# Patient Record
Sex: Female | Born: 1945 | Race: White
Health system: Southern US, Community
[De-identification: ages and names within clinical notes are randomized; demographics above are authoritative.]

## PROBLEM LIST (undated history)

## (undated) DIAGNOSIS — E785 Hyperlipidemia, unspecified: Secondary | ICD-10-CM

## (undated) HISTORY — DX: Hyperlipidemia, unspecified: E78.5

---

## 2016-01-04 ENCOUNTER — Other Ambulatory Visit (INDEPENDENT_AMBULATORY_CARE_PROVIDER_SITE_OTHER): Payer: Self-pay

## 2016-01-04 LAB — COMPREHENSIVE METABOLIC PANEL
ALT: 25 U/L (ref 10–50)
AST (SGOT): 17 U/L (ref 0–40)
African American eGFR: 115.79
Albumin: 4.2 g/dL (ref 3.5–5.2)
Alkaline Phosphatase: 39 U/L (ref 35–130)
BUN / Creatinine Ratio: 26.9
BUN: 17 mg/dL (ref 6–23)
Bilirubin, Total: 0.4 mg/dL (ref 0.0–1.2)
CO2: 22 mmol/L (ref 19–31)
Calcium: 9.3 mg/dL (ref 8.6–10.2)
Chloride: 104 mEq/l (ref 97–107)
Creatinine: 0.61 mg/dL (ref 0.50–1.00)
Glucose: 119 mg/dL — ABNORMAL HIGH (ref 74–106)
Potassium: 4.2 mmol/L (ref 3.5–5.1)
Protein, Total: 6.22 g/dL — ABNORMAL LOW (ref 6.30–8.70)
Sodium: 140 mmol/L (ref 135–145)
non-African American eGFR: 95.69

## 2016-01-04 LAB — LIPID PANEL
Cholesterol / HDL Ratio: 2.4 (ref 0.0–3.6)
Cholesterol: 117 mg/dL (ref 0–200)
HDL: 49 mg/dL (ref >40)
LDL Calculated: 49 mg/dL (ref 0–129)
NON HDL CHOLESTEROL: 67 mg/dL
Triglycerides: 92 mg/dL (ref 0–150)

## 2016-01-04 LAB — HEMOGLOBIN A1C: Hemoglobin A1C: 5.8 g/dL — ABNORMAL HIGH (ref 3.8–5.7)

## 2017-01-12 LAB — COMPREHENSIVE METABOLIC PANEL
ALT: 29 U/L (ref 10–50)
AST (SGOT): 25 U/L (ref 0–40)
African American eGFR: 117.32
Albumin: 4.3 g/dL (ref 3.5–5.2)
Alkaline Phosphatase: 44 U/L (ref 35–130)
BUN / Creatinine Ratio: 27.9
BUN: 17 mg/dL (ref 6–23)
Bilirubin, Total: 0.4 mg/dL (ref 0.0–1.2)
CO2: 27 mmol/L (ref 19–31)
Calcium: 9.8 mg/dL (ref 8.6–10.2)
Chloride: 101 mEq/l (ref 97–107)
Creatinine: 0.61 mg/dL (ref 0.50–1.00)
Glucose: 130 mg/dL — ABNORMAL HIGH (ref 74–106)
Potassium: 4.6 mmol/L (ref 3.5–5.1)
Protein, Total: 6.8 g/dL (ref 6.30–8.70)
Sodium: 139 mmol/L (ref 135–145)
non-African American eGFR: 96.96

## 2017-01-12 LAB — LIPID PANEL
Cholesterol / HDL Ratio: 3.3 (ref 0.0–3.6)
Cholesterol: 143 mg/dL (ref 0–200)
HDL: 43 mg/dL (ref >40)
LDL Calculated: 75 mg/dL (ref 0–129)
NON HDL CHOLESTEROL: 100 mg/dL
Triglycerides: 128 mg/dL (ref 0–150)

## 2017-01-12 LAB — HEMOGLOBIN A1C: Hemoglobin A1C: 6.3 g/dL — ABNORMAL HIGH (ref 3.8–5.7)

## 2017-05-15 LAB — BASIC METABOLIC PANEL
African American eGFR: 103.04
BUN / Creatinine Ratio: 36.7
BUN: 25 mg/dL — ABNORMAL HIGH (ref 6–23)
CO2: 26 mmol/L (ref 19–31)
Calcium: 9.9 mg/dL (ref 8.6–10.2)
Chloride: 102 mEq/l (ref 97–107)
Creatinine: 0.68 mg/dL (ref 0.50–1.00)
Glucose: 125 mg/dL — ABNORMAL HIGH (ref 74–106)
Potassium: 4.3 mmol/L (ref 3.5–5.1)
Sodium: 138 mmol/L (ref 135–145)
non-African American eGFR: 85.16

## 2017-05-15 LAB — HEMOGLOBIN A1C: Hemoglobin A1C: 5.6 g/dL (ref 3.8–5.7)

## 2017-09-07 LAB — BASIC METABOLIC PANEL
African American eGFR: 98.84
BUN / Creatinine Ratio: 33.8
BUN: 24 mg/dL — ABNORMAL HIGH (ref 6–23)
CO2: 28 mmol/L (ref 19–31)
Calcium: 10.2 mg/dL (ref 8.6–10.2)
Chloride: 105 mEq/l (ref 97–107)
Creatinine: 0.7 mg/dL (ref 0.50–1.00)
Glucose: 123 mg/dL — ABNORMAL HIGH (ref 74–106)
Potassium: 4.9 mmol/L (ref 3.5–5.1)
Sodium: 144 mmol/L (ref 135–145)
non-African American eGFR: 81.69

## 2018-01-20 LAB — COMPREHENSIVE METABOLIC PANEL
ALT: 22 U/L (ref 10–50)
AST (SGOT): 19 U/L (ref 0–40)
African American eGFR: 97.33
Albumin: 4.4 g/dL (ref 3.5–5.2)
Alkaline Phosphatase: 43 U/L (ref 35–130)
BUN / Creatinine Ratio: 24.3
BUN: 17 mg/dL (ref 6–23)
Bilirubin, Total: 0.4 mg/dL (ref 0.0–1.2)
CO2: 25 mmol/L (ref 19–31)
Calcium: 9.7 mg/dL (ref 8.6–10.2)
Chloride: 104 mEq/l (ref 97–107)
Creatinine: 0.71 mg/dL (ref 0.50–1.00)
Glucose: 130 mg/dL — ABNORMAL HIGH (ref 74–106)
Potassium: 4.4 mmol/L (ref 3.5–5.1)
Protein, Total: 6.61 g/dL (ref 6.30–8.70)
Sodium: 141 mmol/L (ref 135–145)
non-African American eGFR: 80.44

## 2018-01-20 LAB — LIPID PANEL
Cholesterol / HDL Ratio: 2.9 (ref 0.0–3.6)
Cholesterol: 133 mg/dL (ref 0–200)
HDL: 46 mg/dL (ref >40)
LDL Calculated: 63 mg/dL (ref 0–129)
NON HDL CHOLESTEROL: 86 mg/dL
Triglycerides: 118 mg/dL (ref 0–150)

## 2018-01-20 LAB — HEMOGLOBIN A1C: Hemoglobin A1C: 5.7 g/dL (ref 3.8–5.7)

## 2018-06-15 ENCOUNTER — Other Ambulatory Visit (INDEPENDENT_AMBULATORY_CARE_PROVIDER_SITE_OTHER): Payer: Self-pay

## 2018-06-16 LAB — BASIC METABOLIC PANEL
African American eGFR: 97 mL/min/{1.73_m2} (ref >=60)
BUN / Creatinine Ratio: 29.6 (calc) (ref 6–22)
BUN: 21 mg/dL (ref 7–25)
CO2: 27 mmol/L (ref 20–32)
Calcium: 10 mg/dL (ref 8.6–10.4)
Chloride: 105 mmol/L (ref 98–110)
Creatinine: 0.71 mg/dL (ref 0.60–0.93)
Glucose: 112 mg/dL — ABNORMAL HIGH (ref 65–99)
Potassium: 5.5 mmol/L — ABNORMAL HIGH (ref 3.5–5.3)
Sodium: 140 mmol/L (ref 135–146)
non-African American eGFR: 80 mL/min/{1.73_m2} (ref >=60)

## 2018-06-16 LAB — HEMOGLOBIN A1C: Hemoglobin A1C: 5.4 % of total Hgb (ref <5.7)

## 2018-12-30 ENCOUNTER — Encounter (INDEPENDENT_AMBULATORY_CARE_PROVIDER_SITE_OTHER): Payer: Self-pay

## 2019-01-12 ENCOUNTER — Encounter (INDEPENDENT_AMBULATORY_CARE_PROVIDER_SITE_OTHER): Payer: Self-pay | Admitting: Internal Medicine

## 2019-01-12 DIAGNOSIS — Z9884 Bariatric surgery status: Secondary | ICD-10-CM | POA: Insufficient documentation

## 2019-01-12 DIAGNOSIS — M85862 Other specified disorders of bone density and structure, left lower leg: Secondary | ICD-10-CM | POA: Insufficient documentation

## 2019-01-12 DIAGNOSIS — R778 Other specified abnormalities of plasma proteins: Secondary | ICD-10-CM | POA: Insufficient documentation

## 2019-01-12 DIAGNOSIS — R790 Abnormal level of blood mineral: Secondary | ICD-10-CM | POA: Insufficient documentation

## 2019-01-12 DIAGNOSIS — R7302 Impaired glucose tolerance (oral): Secondary | ICD-10-CM | POA: Insufficient documentation

## 2019-01-12 DIAGNOSIS — M47816 Spondylosis without myelopathy or radiculopathy, lumbar region: Secondary | ICD-10-CM | POA: Insufficient documentation

## 2019-01-12 DIAGNOSIS — E041 Nontoxic single thyroid nodule: Secondary | ICD-10-CM | POA: Insufficient documentation

## 2019-01-12 DIAGNOSIS — K2950 Unspecified chronic gastritis without bleeding: Secondary | ICD-10-CM | POA: Insufficient documentation

## 2019-01-12 DIAGNOSIS — Z889 Allergy status to unspecified drugs, medicaments and biological substances status: Secondary | ICD-10-CM | POA: Insufficient documentation

## 2019-01-12 DIAGNOSIS — D803 Selective deficiency of immunoglobulin G [IgG] subclasses: Secondary | ICD-10-CM | POA: Insufficient documentation

## 2019-01-12 DIAGNOSIS — E875 Hyperkalemia: Secondary | ICD-10-CM | POA: Insufficient documentation

## 2019-01-12 DIAGNOSIS — R202 Paresthesia of skin: Secondary | ICD-10-CM | POA: Insufficient documentation

## 2019-01-12 DIAGNOSIS — E349 Endocrine disorder, unspecified: Secondary | ICD-10-CM | POA: Insufficient documentation

## 2019-01-12 DIAGNOSIS — H269 Unspecified cataract: Secondary | ICD-10-CM | POA: Insufficient documentation

## 2019-01-12 DIAGNOSIS — G629 Polyneuropathy, unspecified: Secondary | ICD-10-CM | POA: Insufficient documentation

## 2019-01-12 DIAGNOSIS — R911 Solitary pulmonary nodule: Secondary | ICD-10-CM | POA: Insufficient documentation

## 2019-01-12 DIAGNOSIS — I2510 Atherosclerotic heart disease of native coronary artery without angina pectoris: Secondary | ICD-10-CM | POA: Insufficient documentation

## 2019-01-12 DIAGNOSIS — F4320 Adjustment disorder, unspecified: Secondary | ICD-10-CM | POA: Insufficient documentation

## 2019-01-12 DIAGNOSIS — R9431 Abnormal electrocardiogram [ECG] [EKG]: Secondary | ICD-10-CM | POA: Insufficient documentation
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
## 2019-01-26 ENCOUNTER — Ambulatory Visit (FREE_STANDING_LABORATORY_FACILITY): Payer: Medicare Other

## 2019-01-26 ENCOUNTER — Encounter (INDEPENDENT_AMBULATORY_CARE_PROVIDER_SITE_OTHER): Payer: Self-pay

## 2019-01-26 VITALS — BP 132/78 | HR 75 | Temp 96.9°F | Ht 59.0 in | Wt 134.0 lb

## 2019-01-26 DIAGNOSIS — M85862 Other specified disorders of bone density and structure, left lower leg: Secondary | ICD-10-CM

## 2019-01-26 DIAGNOSIS — Z0000 Encounter for general adult medical examination without abnormal findings: Secondary | ICD-10-CM

## 2019-01-26 DIAGNOSIS — K2950 Unspecified chronic gastritis without bleeding: Secondary | ICD-10-CM

## 2019-01-26 DIAGNOSIS — E785 Hyperlipidemia, unspecified: Secondary | ICD-10-CM | POA: Insufficient documentation

## 2019-01-26 DIAGNOSIS — Z13820 Encounter for screening for osteoporosis: Secondary | ICD-10-CM

## 2019-01-26 DIAGNOSIS — R7302 Impaired glucose tolerance (oral): Secondary | ICD-10-CM

## 2019-01-26 DIAGNOSIS — E782 Mixed hyperlipidemia: Secondary | ICD-10-CM

## 2019-01-26 DIAGNOSIS — M47816 Spondylosis without myelopathy or radiculopathy, lumbar region: Secondary | ICD-10-CM

## 2019-01-26 DIAGNOSIS — E559 Vitamin D deficiency, unspecified: Secondary | ICD-10-CM

## 2019-01-26 DIAGNOSIS — Z1231 Encounter for screening mammogram for malignant neoplasm of breast: Secondary | ICD-10-CM

## 2019-01-26 DIAGNOSIS — R946 Abnormal results of thyroid function studies: Secondary | ICD-10-CM

## 2019-01-26 DIAGNOSIS — Z1389 Encounter for screening for other disorder: Secondary | ICD-10-CM

## 2019-01-26 DIAGNOSIS — Z9884 Bariatric surgery status: Secondary | ICD-10-CM

## 2019-01-26 LAB — CBC AND DIFFERENTIAL
Absolute NRBC: 0 10*3/uL (ref 0.00–0.00)
Basophils Absolute Automated: 0.05 10*3/uL (ref 0.00–0.08)
Basophils Automated: 0.7 %
Eosinophils Absolute Automated: 0.11 10*3/uL (ref 0.00–0.44)
Eosinophils Automated: 1.6 %
Hematocrit: 41.2 % (ref 34.7–43.7)
Hgb: 13.3 g/dL (ref 11.4–14.8)
Immature Granulocytes Absolute: 0.02 10*3/uL (ref 0.00–0.07)
Immature Granulocytes: 0.3 %
Lymphocytes Absolute Automated: 1.77 10*3/uL (ref 0.42–3.22)
Lymphocytes Automated: 25.3 %
MCH: 30 pg (ref 25.1–33.5)
MCHC: 32.3 g/dL (ref 31.5–35.8)
MCV: 92.8 fL (ref 78.0–96.0)
MPV: 10.6 fL (ref 8.9–12.5)
Monocytes Absolute Automated: 0.67 10*3/uL (ref 0.21–0.85)
Monocytes: 9.6 %
Neutrophils Absolute: 4.37 10*3/uL (ref 1.10–6.33)
Neutrophils: 62.5 %
Nucleated RBC: 0 /100 WBC (ref 0.0–0.0)
Platelets: 261 10*3/uL (ref 142–346)
RBC: 4.44 10*6/uL (ref 3.90–5.10)
RDW: 13 % (ref 11–15)
WBC: 6.99 10*3/uL (ref 3.10–9.50)

## 2019-01-26 MED ORDER — GABAPENTIN 300 MG PO CAPS
300.00 mg | ORAL_CAPSULE | Freq: Two times a day (BID) | ORAL | 1 refills | Status: DC
Start: 2019-01-26 — End: 2019-05-10

## 2019-01-26 MED ORDER — PANTOPRAZOLE SODIUM 40 MG PO TBEC
40.00 mg | DELAYED_RELEASE_TABLET | Freq: Every day | ORAL | 1 refills | Status: DC | PRN
Start: 2019-01-26 — End: 2019-01-26

## 2019-01-26 MED ORDER — METFORMIN HCL 500 MG PO TABS
250.0000 mg | ORAL_TABLET | Freq: Two times a day (BID) | ORAL | 4 refills | Status: DC
Start: 2019-01-26 — End: 2020-02-06

## 2019-01-26 MED ORDER — FLUOXETINE HCL 20 MG PO CAPS
20.00 mg | ORAL_CAPSULE | Freq: Every day | ORAL | 4 refills | Status: DC
Start: 2019-01-26 — End: 2019-10-22

## 2019-01-26 MED ORDER — ZOLPIDEM TARTRATE ER 6.25 MG PO TBCR
6.25 mg | EXTENDED_RELEASE_TABLET | Freq: Every evening | ORAL | 1 refills | Status: DC | PRN
Start: 2019-01-26 — End: 2020-02-06

## 2019-01-26 MED ORDER — ALENDRONATE SODIUM 70 MG PO TABS
70.0000 mg | ORAL_TABLET | ORAL | 4 refills | Status: DC
Start: 2019-01-26 — End: 2019-07-02

## 2019-01-26 NOTE — Progress Notes (Signed)
[facility] INTERNAL MEDICINE - AN [place] [facility]                 Date of Exam: 01/26/2019 5:56 PM        Patient ID: Courtney Solis is a 73 y.o. female.        Chief Complaint:    Chief Complaint   Patient presents with   . Medicare Annual Wellness Visit - Subsequent         Screening tests for dementia, fall, depression and functional assessment done       HPI:    HPI   Visit Type: Health Maintenance Visit  Reported Health: excellent health  Dental: dentist visit within 6-12 months  Vision: glasses and regular eye exams   Hearing: normal hearing  Immunization Status: immunizations up to date  Reproductive Health: not currently sexually active  Safety Elements Used: uses seat belts and smoke detectors in household            Problem List    Patient Active Problem List   Diagnosis   . Abnormal EKG   . Abnormal SPEP   . Adult situational stress disorder   . Benign thyroid cyst   . Cataract, acquired   . Chronic gastritis without bleeding, unspecified gastritis type   . Coronary artery calcification seen on CAT scan   . Elevated parathyroid hormone   . High potassium   . History of gastric bypass   . IgG deficiency   . IGT (impaired glucose tolerance)   . Low magnesium level   . Multiple allergies   . Notalgia paresthetica   . Neuropathy   . Osteopenia of left lower leg   . Pulmonary nodule   . Spondylosis of lumbar spine   . Hyperlipidemia             Problem List History    Problem   Hyperlipidemia                Current Meds:    Outpatient Medications Marked as Taking for the 01/26/19 encounter (Office Visit) with Itzae Miralles, Ashley Akin, MD   Medication Sig Dispense Refill   . [DISCONTINUED] alendronate (FOSAMAX) 70 MG tablet Take 1 tablet by mouth once a week     . [DISCONTINUED] metFORMIN (GLUCOPHAGE) 500 MG tablet Take 500 mg by mouth daily            Allergies:    Allergies   Allergen Reactions   . Bee Pollen Anaphylaxis     Carries epi pens   . Bee Venom    . Kiwi Extract    . Wound Dressing Adhesive Itching and  Rash             Past Surgical History:    Past Surgical History:   Procedure Laterality Date   . ARTHROSCOPY, KNEE Right 1980   . BREAST BIOPSY Left 1996   . CATARACT EXTRACTION, BILATERAL  2019   . CHOLECYSTECTOMY  2011   . CHOLECYSTECTOMY  2016   . GASTRIC BYPASS  11/29/2002   . REDUCTION MAMMAPLASTY  12/2011   . ROTATOR CUFF REPAIR Right            Family History:    Family History   Problem Relation Age of Onset   . Hyperlipidemia Mother    . Breast cancer Mother    . Thyroid disease Mother    . Stroke Father 68   . Thyroid disease Sister    .  Lymphoma Maternal Grandfather    . Diabetes Other    . Breast cancer Maternal Aunt            Social History:    Social History     Socioeconomic History   . Marital status: Legally Separated     Spouse name: None   . Number of children: None   . Years of education: None   . Highest education level: None   Occupational History   . None   Social Needs   . Financial resource strain: None   . Food insecurity     Worry: None     Inability: None   . Transportation needs     Medical: None     Non-medical: None   Tobacco Use   . Smoking status: Former Smoker     Packs/day: 0.50     Years: 34.00     Pack years: 17.00     Last attempt to quit: 2004     Years since quitting: 16.5   . Smokeless tobacco: Never Used   Substance and Sexual Activity   . Alcohol use: Yes     Frequency: Monthly or less     Drinks per session: 1 or 2     Binge frequency: Monthly     Comment: occasional   . Drug use: Never   . Sexual activity: None   Lifestyle   . Physical activity     Days per week: 3 days     Minutes per session: 60 min   . Stress: None   Relationships   . Social Wellsite geologist on phone: None     Gets together: None     Attends religious service: None     Active member of club or organization: None     Attends meetings of clubs or organizations: None     Relationship status: None   . Intimate partner violence     Fear of current or ex partner: None     Emotionally abused: None      Physically abused: None     Forced sexual activity: None   Other Topics Concern   . None   Social History Narrative   . None           The following sections were reviewed this encounter by the provider:   Tobacco  Allergies  Meds  Problems  Med Hx  Surg Hx  Fam Hx             Vital Signs:    BP 132/78   Pulse 75   Temp (!) 96.9 F (36.1 C) (Temporal)   Ht 1.499 m (4\' 11" )   Wt 60.8 kg (134 lb)   BMI 27.06 kg/m          ROS:    Review of Systems   Constitutional: Negative for fatigue.   HENT: Negative for sinus pain.    Eyes: Negative for visual disturbance.   Respiratory: Negative for cough, chest tightness, shortness of breath and wheezing.    Cardiovascular: Negative for chest pain, palpitations and leg swelling.   Gastrointestinal: Negative for abdominal distention, abdominal pain, blood in stool, constipation, diarrhea and nausea.   Endocrine: Negative for polydipsia and polyphagia.   Genitourinary: Negative for difficulty urinating, dysuria and hematuria.   Musculoskeletal: Positive for back pain (did needle therapy this am, feels it is helping).   Skin: Negative for color change.   Neurological: Negative for  dizziness, weakness, numbness and headaches.   Hematological: Does not bruise/bleed easily.   Psychiatric/Behavioral: Negative.         Feels emotionally stable with current regimen              Physical Exam:    Physical Exam  Vitals signs reviewed.   Constitutional:       Appearance: Normal appearance.   HENT:      Head: Normocephalic and atraumatic.      Right Ear: Tympanic membrane and ear canal normal.      Left Ear: Tympanic membrane and ear canal normal.   Eyes:      Extraocular Movements: Extraocular movements intact.      Conjunctiva/sclera: Conjunctivae normal.   Neck:      Musculoskeletal: Neck supple. No muscular tenderness.      Vascular: No carotid bruit.   Cardiovascular:      Rate and Rhythm: Normal rate and regular rhythm.      Pulses: Normal pulses.      Heart sounds:  Normal heart sounds. No murmur. No friction rub. No gallop.    Pulmonary:      Effort: Pulmonary effort is normal. No respiratory distress.      Breath sounds: Normal breath sounds. No wheezing.   Chest:      Breasts:         Right: Tenderness present. No inverted nipple or skin change.         Left: Tenderness present. No inverted nipple or skin change.      Comments: Dense breast tissue , +scar tissue noted at site of breast reduction surgery    Abdominal:      General: Bowel sounds are normal.      Palpations: Abdomen is soft.   Musculoskeletal:         General: No swelling or tenderness.      Right lower leg: No edema.      Left lower leg: No edema.   Skin:     General: Skin is warm.      Findings: No rash.   Neurological:      General: No focal deficit present.      Mental Status: She is alert and oriented to person, place, and time.   Psychiatric:         Mood and Affect: Mood normal.         Behavior: Behavior normal.              Assessment/Plan:    Health Maintenance:  Recommend optimizing low carbohydrate diet efforts and obtaining at least 150 minutes of aerobic exercise per week. Recommend 20-25 grams of dietary fiber daily. Recommend drinking at least 60-80 ounces of water per day.  Immunizations UTD. Vision screening UTD. Mammogram screening is due.  Screening mammogram ordered. Osteoporosis screening is due.  Dexa ordered.    Problem List Items Addressed This Visit     Chronic gastritis without bleeding, unspecified gastritis type    Pt had repeat EGD done in 2012, it was rec to repeat EGD in 3 years, however, pt declined , she is only taking Protonix 3x week    pantoprazole (PROTONIX) 40 MG tablet        Relevant Ordersdoin    CBC and differential    Lipid panel    IGT (impaired glucose tolerance)    Tried to decrease her Metformin dose but noted inc in her weight gain, pt went back to bid dosing, she continues  to monitor her diet     metFORMIN (GLUCOPHAGE) 500 MG tablet    Other Relevant Orders     Comprehensive metabolic panel    Urinalysis    Hemoglobin A1C    Osteopenia of left lower leg    Tolerating Fosamax, no GI s/e, pt is due for another Dexa    alendronate (FOSAMAX) 70 MG tablet    Other Relevant Orders    TSH    Dxa Bone Density Appendicular Skeleton    Spondylosis of lumbar spine    Starting to note increase lower ext pain, wants to go back to a higher dose of Gabapentin  She also started Dry needling today and notes improvement  She is also interested in CBD treatment, rec she discuss this with her Pain Specialist    gabapentin (NEURONTIN) 300 MG capsule    Other Relevant Orders    CBC and differential    TSH    Hyperlipidemia    Tolerating Lipitor, takes med every other day    atorvastatin (LIPITOR) 10 MG tablet    Other Relevant Orders    Lipid panel    TSH      Other Visit Diagnoses     Medicare annual wellness visit, subsequent    -  Primary    Screening mammogram, encounter for        Relevant Orders    Mammo Digital Screening Bilateral W Cad    Osteoporosis screening        Abnormal thyroid exam          Relevant Orders    [place] Head Neck Soft Tissue    Screening for blood or protein in urine        Relevant Orders    Urinalysis    Vitamin D deficiency        takin gsupplement    Vitamin D,25 OH, Total                     Follow-up:    F/u in 6 months , as needed, pending lab results        Theodosia Blender MD

## 2019-01-27 LAB — COMPREHENSIVE METABOLIC PANEL
ALT: 18 U/L (ref 0–55)
AST (SGOT): 18 U/L (ref 5–34)
Albumin/Globulin Ratio: 1.7 (ref 0.9–2.2)
Albumin: 4.3 g/dL (ref 3.5–5.0)
Alkaline Phosphatase: 40 U/L (ref 37–106)
Anion Gap: 12 (ref 5.0–15.0)
BUN: 21 mg/dL — ABNORMAL HIGH (ref 7.0–19.0)
Bilirubin, Total: 0.6 mg/dL (ref 0.2–1.2)
CO2: 21 mEq/L (ref 21–29)
Calcium: 9.4 mg/dL (ref 7.9–10.2)
Chloride: 106 mEq/L (ref 100–111)
Creatinine: 0.7 mg/dL (ref 0.4–1.5)
Globulin: 2.5 g/dL (ref 2.0–3.7)
Glucose: 100 mg/dL (ref 70–100)
Potassium: 4.4 mEq/L (ref 3.5–5.1)
Protein, Total: 6.8 g/dL (ref 6.0–8.3)
Sodium: 139 mEq/L (ref 136–145)

## 2019-01-27 LAB — VITAMIN D,25 OH,TOTAL: Vitamin D, 25 OH, Total: 58 ng/mL (ref 30–100)

## 2019-01-27 LAB — GFR: EGFR: >60

## 2019-01-27 LAB — LIPID PANEL
Cholesterol / HDL Ratio: 3
Cholesterol: 150 mg/dL (ref 0–199)
HDL: 50 mg/dL (ref 40–9999)
LDL Calculated: 81 mg/dL (ref 0–99)
Triglycerides: 96 mg/dL (ref 34–149)
VLDL Calculated: 19 mg/dL (ref 10–40)

## 2019-01-27 LAB — HEMOLYSIS INDEX: Hemolysis Index: 6 (ref 0–18)

## 2019-01-27 LAB — HEMOGLOBIN A1C
Average Estimated Glucose: 105.4 mg/dL
Hemoglobin A1C: 5.3 % (ref 4.6–5.9)

## 2019-01-27 LAB — TSH: TSH: 1.1 u[IU]/mL (ref 0.35–4.94)

## 2019-02-01 ENCOUNTER — Other Ambulatory Visit (INDEPENDENT_AMBULATORY_CARE_PROVIDER_SITE_OTHER): Payer: Self-pay

## 2019-02-19 ENCOUNTER — Encounter (INDEPENDENT_AMBULATORY_CARE_PROVIDER_SITE_OTHER): Payer: Self-pay

## 2019-02-19 DIAGNOSIS — E041 Nontoxic single thyroid nodule: Secondary | ICD-10-CM | POA: Insufficient documentation

## 2019-03-02 ENCOUNTER — Encounter (INDEPENDENT_AMBULATORY_CARE_PROVIDER_SITE_OTHER): Payer: Self-pay

## 2019-04-14 ENCOUNTER — Encounter (INDEPENDENT_AMBULATORY_CARE_PROVIDER_SITE_OTHER): Payer: Self-pay

## 2019-05-10 ENCOUNTER — Other Ambulatory Visit (INDEPENDENT_AMBULATORY_CARE_PROVIDER_SITE_OTHER): Payer: Self-pay

## 2019-05-10 DIAGNOSIS — M47816 Spondylosis without myelopathy or radiculopathy, lumbar region: Secondary | ICD-10-CM

## 2019-06-29 ENCOUNTER — Other Ambulatory Visit (INDEPENDENT_AMBULATORY_CARE_PROVIDER_SITE_OTHER): Payer: Self-pay

## 2019-06-29 ENCOUNTER — Encounter (INDEPENDENT_AMBULATORY_CARE_PROVIDER_SITE_OTHER): Payer: Self-pay

## 2019-06-29 DIAGNOSIS — M85862 Other specified disorders of bone density and structure, left lower leg: Secondary | ICD-10-CM

## 2019-06-29 DIAGNOSIS — M47816 Spondylosis without myelopathy or radiculopathy, lumbar region: Secondary | ICD-10-CM

## 2019-07-01 ENCOUNTER — Other Ambulatory Visit (INDEPENDENT_AMBULATORY_CARE_PROVIDER_SITE_OTHER): Payer: Self-pay

## 2019-07-01 DIAGNOSIS — M85862 Other specified disorders of bone density and structure, left lower leg: Secondary | ICD-10-CM

## 2019-07-04 ENCOUNTER — Telehealth (INDEPENDENT_AMBULATORY_CARE_PROVIDER_SITE_OTHER): Payer: Medicare Other

## 2019-07-04 ENCOUNTER — Encounter (INDEPENDENT_AMBULATORY_CARE_PROVIDER_SITE_OTHER): Payer: Self-pay

## 2019-07-04 VITALS — Ht 60.0 in | Wt 136.0 lb

## 2019-07-04 DIAGNOSIS — M47816 Spondylosis without myelopathy or radiculopathy, lumbar region: Secondary | ICD-10-CM

## 2019-07-04 DIAGNOSIS — F4320 Adjustment disorder, unspecified: Secondary | ICD-10-CM

## 2019-07-04 MED ORDER — GABAPENTIN 600 MG PO TABS
600.0000 mg | ORAL_TABLET | Freq: Every evening | ORAL | 1 refills | Status: DC
Start: 2019-07-04 — End: 2019-10-22

## 2019-07-04 NOTE — Progress Notes (Signed)
[facility] INTERNAL MEDICINE - AN [place] PARTNER                 Date of Virtual Visit: 07/04/2019 7:58 PM        Patient ID: Courtney Solis is a 74 y.o. female.  Attending Physician: Joellen Jersey, MD       Telemedicine Eligibility:    State Location:  [x]  [place]  []  [place]  []  [place] []  [place]  []  Other:    Personal identity shared with patient:  [x]  Yes  []  No    Education on nature of video visit shared with patient:  [x]  Yes  []  No    Verbal consent has been obtained from the patient to conduct a video and telephone visit to minimize exposure to COVID-19:   [x]  Yes  []  No    Language:  English . Translator was required:   []  Yes  [x]  No    Visit terminated since not appropriate for virtual care:  [x]  N/A  []  Reason:               Chief Complaint:    Chief Complaint   Patient presents with    Medication Refill             HPI:    Last seen 7/20  Here for prescription refill, takes Gabapentin, 600mg  at night for her chronich back pain-has spondylosis of the lumbar spine, not getting steroid injections anyore  She only takes the Gabapentin at night since it helps with her pain and sleep  She does not take any pain meds during the day, she continues to be active, walks one mile a day-she has a new puppy, she still is eating healthy  She also is taking Metformin, continues to tolerate low dose , no GI sxs of diarrhea, no abd bloating  She is doing well overall, she feels  life is more calm and that she feels she is back to herself  She wants to continue to take Prozac 20mg  daily  She is also going to [place] next week for 1 1/2 months              Problem List:    Patient Active Problem List   Diagnosis    Abnormal EKG    Abnormal SPEP    Adult situational stress disorder    Benign thyroid cyst    Cataract, acquired    Chronic gastritis without bleeding, unspecified gastritis type    Coronary artery calcification seen on CAT scan    Elevated parathyroid hormone    High  potassium    History of gastric bypass    IgG deficiency    IGT (impaired glucose tolerance)    Low magnesium level    Multiple allergies    Notalgia paresthetica    Neuropathy    Osteopenia of left lower leg    Pulmonary nodule    Spondylosis of lumbar spine    Hyperlipidemia    Thyroid nodule             Current Meds:    No outpatient medications have been marked as taking for the 07/04/19 encounter (Telemedicine Visit) with Lorik Guo, Ashley Akin, MD.          Allergies:    Allergies   Allergen Reactions    Bee Pollen Anaphylaxis     Carries epi pens    Bee Venom     Kiwi Extract  Wound Dressing Adhesive Itching and Rash               Social History:    Social History     Tobacco Use    Smoking status: Former Smoker     Packs/day: 0.50     Years: 34.00     Pack years: 17.00     Quit date: 2004     Years since quitting: 17.0    Smokeless tobacco: Never Used   Substance Use Topics    Alcohol use: Yes     Frequency: Monthly or less     Drinks per session: 1 or 2     Binge frequency: Monthly     Comment: occasional    Drug use: Never           The following sections were reviewed this encounter by the provider:   Tobacco   Allergies   Meds   Problems   Med Hx   Surg Hx   Fam Hx              Vital Signs:    Vitals:    07/04/19 1006   Weight: 61.7 kg (136 lb)   Height: 1.524 m (5')            ROS:    See HPI          Physical Exam:    Physical Exam  Constitutional:       General: She is not in acute distress.     Appearance: Normal appearance. She is well-developed and well-groomed. She is not ill-appearing.   HENT:      Head: Normocephalic and atraumatic.   Eyes:      Extraocular Movements: Extraocular movements intact.      Conjunctiva/sclera: Conjunctivae normal.   Pulmonary:      Effort: No tachypnea, accessory muscle usage or respiratory distress.   Neurological:      Mental Status: She is alert.   Psychiatric:         Mood and Affect: Mood normal.         Behavior: Behavior normal. Behavior is  cooperative.         Thought Content: Thought content normal.         Judgment: Judgment normal.                Assessment/Plan:        Problem List Items Addressed This Visit     Adult situational stress disorder - Primary-doing very well with taking Prozac  She is not ready to taper her medication at this time frame    Spondylosis of lumbar spine-has been eval by Neurosurgeon/also has seen Pain Specialist in the past, controlled with Gabapentin  Prescription refilled  Enc pt to continue with her positive lifestyle and healthy eating                   Follow-up:    Return in about 6 months (around 01/01/2020) for complete physical exam.         Joellen Jersey, MD     Time spent on telemedicine visit: 

## 2019-07-08 NOTE — Telephone Encounter (Signed)
Sent on 07/04/2019

## 2019-07-10 ENCOUNTER — Encounter (INDEPENDENT_AMBULATORY_CARE_PROVIDER_SITE_OTHER): Payer: Self-pay

## 2019-09-01 ENCOUNTER — Other Ambulatory Visit (INDEPENDENT_AMBULATORY_CARE_PROVIDER_SITE_OTHER): Payer: Self-pay | Admitting: Internal Medicine

## 2019-09-03 ENCOUNTER — Other Ambulatory Visit (INDEPENDENT_AMBULATORY_CARE_PROVIDER_SITE_OTHER): Payer: Self-pay | Admitting: Internal Medicine

## 2019-10-21 ENCOUNTER — Encounter (INDEPENDENT_AMBULATORY_CARE_PROVIDER_SITE_OTHER): Payer: Self-pay

## 2019-10-22 ENCOUNTER — Encounter (INDEPENDENT_AMBULATORY_CARE_PROVIDER_SITE_OTHER): Payer: Self-pay

## 2019-10-22 ENCOUNTER — Telehealth (INDEPENDENT_AMBULATORY_CARE_PROVIDER_SITE_OTHER): Payer: Medicare Other

## 2019-10-22 DIAGNOSIS — F411 Generalized anxiety disorder: Secondary | ICD-10-CM

## 2019-10-22 DIAGNOSIS — M47816 Spondylosis without myelopathy or radiculopathy, lumbar region: Secondary | ICD-10-CM

## 2019-10-22 MED ORDER — GABAPENTIN 600 MG PO TABS
ORAL_TABLET | ORAL | 1 refills | Status: DC
Start: 2019-10-22 — End: 2020-01-02

## 2019-10-22 MED ORDER — FLUOXETINE HCL 40 MG PO CAPS
40.00 mg | ORAL_CAPSULE | Freq: Every day | ORAL | 3 refills | Status: DC
Start: 2019-10-22 — End: 2020-02-06

## 2019-10-22 NOTE — Progress Notes (Signed)
[facility] INTERNAL MEDICINE - AN [place] PARTNER                 Date of Virtual Visit: 10/22/2019 10:31 AM        Patient ID: Courtney Solis is a 74 y.o. female.  Attending Physician: Joellen Jersey, MD       Telemedicine Eligibility:    State Location:  [x]  [place]  []  [place]  []  [place] []  [place]  []  Other:    Personal identity shared with patient:  [x]  Yes  []  No    Education on nature of video visit shared with patient:  [x]  Yes  []  No    Verbal consent has been obtained from the patient to conduct a telephone visit to minimize exposure to COVID-19:   [x]  Yes  []  No    Language:  English . Translator was required:   []  Yes  [x]  No    Visit terminated since not appropriate for virtual care:  [x]  N/A  []  Reason:               Chief Complaint:    Chief Complaint   Patient presents with   . Other     anxiety and change in pain regimen             HPI:    Unable to connect via video visit, it is now a telephone visit  Patient notes increased anxiety, unable to focus, not able to be organized and disciplined like she use to be, not "thriving" as before, has been in this state of mind for the 4 weeks  No SI, no HI  She is also not sleeping well due to joint pain, also worrying about things at night  She feels the Covid 19 pandemic causing her more angst  She also feels her Gabapentin is not as helpful as before, notes more pain at night          Problem List:    Patient Active Problem List   Diagnosis   . Abnormal EKG   . Abnormal SPEP   . Adult situational stress disorder   . Benign thyroid cyst   . Cataract, acquired   . Chronic gastritis without bleeding, unspecified gastritis type   . Coronary artery calcification seen on CAT scan   . Elevated parathyroid hormone   . High potassium   . History of gastric bypass   . IgG deficiency   . IGT (impaired glucose tolerance)   . Low magnesium level   . Multiple allergies   . Notalgia paresthetica   . Neuropathy   . Osteopenia of left lower leg    . Pulmonary nodule   . Spondylosis of lumbar spine   . Hyperlipidemia   . Thyroid nodule             Current Meds:    No outpatient medications have been marked as taking for the 10/22/19 encounter (Telemedicine Visit) with Ayn Domangue, Ashley Akin, MD.          Allergies:    Allergies   Allergen Reactions   . Bee Pollen Anaphylaxis     Carries epi pens   . Bee Venom    . Kiwi Extract    . Wound Dressing Adhesive Itching and Rash               Social History:    Social History     Tobacco Use   . Smoking status:  Former Smoker     Packs/day: 0.50     Years: 34.00     Pack years: 17.00     Quit date: 2004     Years since quitting: 17.3   . Smokeless tobacco: Never Used   Substance Use Topics   . Alcohol use: Yes     Comment: occasional   . Drug use: Never           The following sections were reviewed this encounter by the provider:   Tobacco  Allergies  Meds  Problems  Med Hx  Surg Hx  Fam Hx             Vital Signs:    There were no vitals filed for this visit.         ROS:    See HPI          Physical Exam:    Voice normal, no acute distress        Assessment/Plan:        Problem List Items Addressed This Visit     Spondylosis of lumbar spine - was evaluate by Spine Surgeon in the past, told she was inoperable. Unfortunately, she notes more pain and has difficulty with sleeping at night. Recommend a trial of increasing her Gabapentin from 600mg  to 900mg  at night and a trial of 300mg  in am. She is aware of the potential side effects ie constipation, dry mouth, dry eyes and drowsiness          Other Visit Diagnoses     GAD (generalized anxiety disorder)  -Patient notes increased anxiety, unable to focus, not able to be organized and disciplined like she use to be, not "thriving" as before, has been in this state of mind for the 4 weeks  No SI, no HI  Recommend increasing her Prozac dose from 20mg  to 40mg  daily. Also recommend she see a Therapist. Recommeded [facility] for talk therapy. Reviewed with pt on how  to get to the web site and pick a Psychologist      Relevant Medications    FLUoxetine (PROzac) 40 MG capsule                   Follow-up:    Return in about 4 weeks (around 11/19/2019) for video visit.         Joellen Jersey, MD     Time spent on telemedicine visit: `18 min

## 2019-12-15 ENCOUNTER — Telehealth (INDEPENDENT_AMBULATORY_CARE_PROVIDER_SITE_OTHER): Payer: Medicare Other | Admitting: Physician Assistant

## 2019-12-15 ENCOUNTER — Encounter (INDEPENDENT_AMBULATORY_CARE_PROVIDER_SITE_OTHER): Payer: Self-pay | Admitting: Physician Assistant

## 2019-12-15 VITALS — Ht 60.0 in | Wt 136.0 lb

## 2019-12-15 DIAGNOSIS — F411 Generalized anxiety disorder: Secondary | ICD-10-CM

## 2019-12-15 NOTE — Progress Notes (Addendum)
[facility] INTERNAL MEDICINE - AN [place] PARTNER                 Date of Virtual Visit: 12/15/2019 12:18 PM        Patient ID: Courtney Solis is a 74 y.o. female.  Attending Physician: Janae Bridgeman, PA       Telemedicine Eligibility:    State Location:  [x]  [place]  []  [place]  []  [place] []  [place]  []  Other:    Personal identity shared with patient:  [x]  Yes  []  No    Education on nature of video visit shared with patient:  [x]  Yes  []  No    Verbal consent has been obtained from the patient to conduct a video and telephone visit to minimize exposure to COVID-19:   [x]  Yes  []  No    Language:  English . Translator was required:   []  Yes  [x]  No    Visit terminated since not appropriate for virtual care:  [x]  N/A  []  Reason:               Chief Complaint:    Chief Complaint   Patient presents with   . Anxiety medicine f/u             HPI:    Anxiety  Presents for follow-up visit. Patient reports no chest pain, decreased concentration, depressed mood, dizziness, dry mouth, palpitations or panic. Symptoms occur rarely.       Pt was seen about 4 weeks ago. At that time, her fluoxetine was increased to 40mg  daily. She felt like she was not thriving given the current world situation. She states the increase has made a significant impact on her sxs. She has been meditating which has helped as well. She denies HI or SI.          Problem List:    Patient Active Problem List   Diagnosis   . Abnormal EKG   . Abnormal SPEP   . Adult situational stress disorder   . Benign thyroid cyst   . Cataract, acquired   . Chronic gastritis without bleeding, unspecified gastritis type   . Coronary artery calcification seen on CAT scan   . Elevated parathyroid hormone   . High potassium   . History of gastric bypass   . IgG deficiency   . IGT (impaired glucose tolerance)   . Low magnesium level   . Multiple allergies   . Notalgia paresthetica   . Neuropathy   . Osteopenia of left lower leg   . Pulmonary nodule    . Spondylosis of lumbar spine   . Hyperlipidemia   . Thyroid nodule             Current Meds:    Outpatient Medications Marked as Taking for the 12/15/19 encounter (Telemedicine Visit) with Janae Bridgeman, PA   Medication Sig Dispense Refill   . alendronate (FOSAMAX) 70 MG tablet TAKE ONE TABLET PER WEEK 12 tablet 3   . atorvastatin (LIPITOR) 10 MG tablet TAKE ONE TABLET BY MOUTH EVERY OTHER DAY 45 tablet 4   . b complex vitamins capsule Take 1 capsule by mouth daily     . calcium carbonate-vitamin D (Calcium 600+D) 600-400 MG-UNIT per tablet Take 1 tablet by mouth 2 (two) times daily     . Cyanocobalamin (VITAMIN B 12 PO) Take 500 mcg by mouth daily     . FLUoxetine (PROzac) 40 MG capsule Take 1  capsule (40 mg total) by mouth daily 30 capsule 3   . fluticasone (FLONASE) 50 MCG/ACT nasal spray 2 sprays by Nasal route daily     . gabapentin (Neurontin) 600 MG tablet One in am and 3 pills at night 120 tablet 1   . MAGNESIUM OXIDE PO Take 500 mg by mouth daily     . metFORMIN (GLUCOPHAGE) 500 MG tablet Take 0.5 tablets (250 mg total) by mouth 2 (two) times daily With food 180 tablet 4   . Multiple Vitamins-Minerals (multivitamin with minerals) tablet Take 1 tablet by mouth daily     . pantoprazole (PROTONIX) 40 MG tablet Take 1 tablet by mouth Once every Monday, Wednesday, and Friday evening       . vitamin D (CHOLECALCIFEROL) 25 MCG (1000 UT) tablet Take 1 tablet by mouth every other day       . zolpidem (AMBIEN CR) 6.25 MG CR tablet Take 1 tablet (6.25 mg total) by mouth nightly as needed (as needed) 20 tablet 1          Allergies:    Allergies   Allergen Reactions   . Bee Pollen Anaphylaxis     Carries epi pens   . Bee Venom    . Kiwi Extract    . Wound Dressing Adhesive Itching and Rash             Past Surgical History:    Past Surgical History:   Procedure Laterality Date   . ARTHROSCOPY, KNEE Right 1980   . BREAST BIOPSY Left 1996   . CATARACT EXTRACTION, BILATERAL  2019   . CHOLECYSTECTOMY  2011   .  CHOLECYSTECTOMY  2016   . GASTRIC BYPASS  11/29/2002   . REDUCTION MAMMAPLASTY  12/2011   . ROTATOR CUFF REPAIR Right            Family History:    Family History   Problem Relation Age of Onset   . Hyperlipidemia Mother    . Breast cancer Mother    . Thyroid disease Mother    . Stroke Father 72   . Thyroid disease Sister    . Lymphoma Maternal Grandfather    . Diabetes Other    . Breast cancer Maternal Aunt            Social History:    Social History     Tobacco Use   . Smoking status: Former Smoker     Packs/day: 0.50     Years: 34.00     Pack years: 17.00     Quit date: 2004     Years since quitting: 17.4   . Smokeless tobacco: Never Used   Substance Use Topics   . Alcohol use: Yes     Comment: occasional           The following sections were reviewed this encounter by the provider:   Tobacco  Allergies  Meds  Problems  Med Hx  Surg Hx  Fam Hx             Vital Signs:    Vitals:    12/15/19 1026   Weight: 61.7 kg (136 lb)   Height: 1.524 m (5')            ROS:    Review of Systems   Constitutional: Negative for fatigue.   Cardiovascular: Negative for chest pain and palpitations.   Skin: Negative for rash.   Neurological: Negative for dizziness.   Psychiatric/Behavioral: Negative for  decreased concentration.              Physical Exam:    Physical Exam  Constitutional:       Appearance: Normal appearance.   HENT:      Head: Normocephalic and atraumatic.   Eyes:      Conjunctiva/sclera: Conjunctivae normal.   Pulmonary:      Effort: Pulmonary effort is normal.   Neurological:      Mental Status: She is alert.   Psychiatric:         Mood and Affect: Mood normal.         Behavior: Behavior normal.         Thought Content: Thought content normal.         Judgment: Judgment normal.                Assessment/Plan:        Problem List Items Addressed This Visit     None      Visit Diagnoses     GAD (generalized anxiety disorder)    -  Primary       1. Pt is doing much better on the higher dose of medication.  Advised to continue current meds. She is tolerating them well. She continues to try and connect with a therapist. I encouraged her to continue meditation as well. We also discussed use of the Calm app to help with sleep and relaxation. She will keep her already scheduled appt in August.             Follow-up:    Return Keep scheduled appt.         Courtney Solis, [place]       Chart Review Note   I have reviewed the history and physical note and findings. I agree with plan.  Renaldo Reel, MD

## 2019-12-19 ENCOUNTER — Telehealth (INDEPENDENT_AMBULATORY_CARE_PROVIDER_SITE_OTHER): Payer: Medicare Other

## 2020-01-01 ENCOUNTER — Other Ambulatory Visit (INDEPENDENT_AMBULATORY_CARE_PROVIDER_SITE_OTHER): Payer: Self-pay

## 2020-01-03 ENCOUNTER — Telehealth (INDEPENDENT_AMBULATORY_CARE_PROVIDER_SITE_OTHER): Payer: Self-pay

## 2020-01-06 NOTE — Telephone Encounter (Signed)
I called the pt to let her know that her refill request had been refused. Courtney Solis confirmed that she did have enough medication left and that the most recent request must have been an accident.

## 2020-01-17 NOTE — Telephone Encounter (Signed)
completed

## 2020-01-27 ENCOUNTER — Encounter (INDEPENDENT_AMBULATORY_CARE_PROVIDER_SITE_OTHER): Payer: Medicare Other

## 2020-02-01 ENCOUNTER — Other Ambulatory Visit (INDEPENDENT_AMBULATORY_CARE_PROVIDER_SITE_OTHER): Payer: Self-pay

## 2020-02-06 ENCOUNTER — Encounter (INDEPENDENT_AMBULATORY_CARE_PROVIDER_SITE_OTHER): Payer: Self-pay

## 2020-02-06 ENCOUNTER — Ambulatory Visit (FREE_STANDING_LABORATORY_FACILITY): Payer: Medicare Other | Admitting: Physician Assistant

## 2020-02-06 VITALS — BP 116/74 | HR 78 | Temp 97.8°F | Resp 18 | Ht 60.0 in | Wt 143.4 lb

## 2020-02-06 DIAGNOSIS — Z0000 Encounter for general adult medical examination without abnormal findings: Secondary | ICD-10-CM

## 2020-02-06 DIAGNOSIS — Z1211 Encounter for screening for malignant neoplasm of colon: Secondary | ICD-10-CM

## 2020-02-06 DIAGNOSIS — F4320 Adjustment disorder, unspecified: Secondary | ICD-10-CM

## 2020-02-06 DIAGNOSIS — F330 Major depressive disorder, recurrent, mild: Secondary | ICD-10-CM

## 2020-02-06 DIAGNOSIS — Z1159 Encounter for screening for other viral diseases: Secondary | ICD-10-CM

## 2020-02-06 DIAGNOSIS — Z6828 Body mass index (BMI) 28.0-28.9, adult: Secondary | ICD-10-CM

## 2020-02-06 DIAGNOSIS — M47816 Spondylosis without myelopathy or radiculopathy, lumbar region: Secondary | ICD-10-CM

## 2020-02-06 DIAGNOSIS — R7302 Impaired glucose tolerance (oral): Secondary | ICD-10-CM

## 2020-02-06 DIAGNOSIS — E782 Mixed hyperlipidemia: Secondary | ICD-10-CM

## 2020-02-06 LAB — COMPREHENSIVE METABOLIC PANEL
ALT: 17 U/L (ref 0–55)
AST (SGOT): 18 U/L (ref 5–34)
Albumin/Globulin Ratio: 1.6 (ref 0.9–2.2)
Albumin: 4.2 g/dL (ref 3.5–5.0)
Alkaline Phosphatase: 41 U/L (ref 37–106)
Anion Gap: 13 (ref 5.0–15.0)
BUN: 21 mg/dL — ABNORMAL HIGH (ref 7.0–19.0)
Bilirubin, Total: 0.6 mg/dL (ref 0.2–1.2)
CO2: 21 mEq/L (ref 21–29)
Calcium: 9 mg/dL (ref 7.9–10.2)
Chloride: 103 mEq/L (ref 100–111)
Creatinine: 0.8 mg/dL (ref 0.4–1.5)
Globulin: 2.7 g/dL (ref 2.0–3.7)
Glucose: 108 mg/dL — ABNORMAL HIGH (ref 70–100)
Potassium: 5 mEq/L (ref 3.5–5.1)
Protein, Total: 6.9 g/dL (ref 6.0–8.3)
Sodium: 137 mEq/L (ref 136–145)

## 2020-02-06 LAB — LIPID PANEL
Cholesterol / HDL Ratio: 3.2
Cholesterol: 173 mg/dL (ref 0–199)
HDL: 54 mg/dL (ref 40–9999)
LDL Calculated: 102 mg/dL — ABNORMAL HIGH (ref 0–99)
Triglycerides: 84 mg/dL (ref 34–149)
VLDL Calculated: 17 mg/dL (ref 10–40)

## 2020-02-06 LAB — HEMOLYSIS INDEX: Hemolysis Index: 5 (ref 0–18)

## 2020-02-06 LAB — GFR: EGFR: >60

## 2020-02-06 MED ORDER — METFORMIN HCL 500 MG PO TABS
250.0000 mg | ORAL_TABLET | Freq: Two times a day (BID) | ORAL | 4 refills | Status: DC
Start: 2020-02-06 — End: 2020-12-07

## 2020-02-06 MED ORDER — ZOLPIDEM TARTRATE ER 6.25 MG PO TBCR
6.2500 mg | EXTENDED_RELEASE_TABLET | Freq: Every evening | ORAL | 1 refills | Status: DC | PRN
Start: 2020-02-06 — End: 2020-12-07

## 2020-02-06 MED ORDER — FLUOXETINE HCL 40 MG PO CAPS
40.0000 mg | ORAL_CAPSULE | Freq: Every day | ORAL | 3 refills | Status: DC
Start: 2020-02-06 — End: 2020-05-11

## 2020-02-06 MED ORDER — GABAPENTIN 600 MG PO TABS
ORAL_TABLET | ORAL | 1 refills | Status: DC
Start: 2020-02-06 — End: 2020-05-23

## 2020-02-06 NOTE — Progress Notes (Signed)
I have reviewed this note - the history, physical findings & data.  The management plan was formulated under my supervision.  Shabreka Coulon MD.

## 2020-02-06 NOTE — Progress Notes (Signed)
[facility] - AN [place] PART[facility]            LEDORA DELKER is a 74 y.o. female who presents today for the following Medicare Wellness Visit:  []  Initial Preventive Physical Exam (IPPE) - "Welcome to Medicare" preventive visit (Vision Screening required)   []  Annual Wellness Visit - Initial  [x]  Annual Wellness Visit - Subsequent     Health Risk Assessment:   During the past month, how would you rate your general health?:  Very Good  Which of the following tasks can you do without assistance - drive or take the bus alone; shop for groceries or clothes; prepare your own meals; do your own housework/laundry; handle your own finances/pay bills; eat, bathe or get around your home?: Drive or take the bus alone, Do your own housework/laundry, Shop for groceries or clothes, Handle your own finances/pay bills, Prepare your own meals, Eat, bathe, dress or get around your home  Which of the following problems have you been bothered by in the past month - dizzy when standing up; problems using the phone; feeling tired or fatigued; moderate or severe body pain?: None of these  Do you exercise for about 20 minutes 3 or more days per week?:Yes  During the past month was someone available to help if you needed and wanted help?  For example, if you felt nervous, lonely, got sick and had to stay in bed, needed someone to talk to, needed help with daily chores or needed help just taking care of yourself.: No  Do you always wear a seat belt?: Yes  Do you have any trouble taking medications the way you have been told to take them?: No  Have you been given any information that can help you with keeping track of your medications?: No  Do you have trouble paying for your medications?: No  Have you been given any information that can help you with hazards in your house, such as scatter rugs, furniture, etc?: No  Do you feel unsteady when standing or walking?: No  Do you worry about falling?: No  Have you fallen two or  more times in the past year?: No  Did you suffer any injuries from your falls in the past year?: No   In addition, pt also needs medication refill., In addition, pt has hyperlipidemia and is compliant with lipid therapy.  Pt denies side effects of lipid therapy - specifically denies myalgia.   Pt has h/o prediabetes. She is on metformin 250mg  bid. She is tolerating it well. She denies polyuria, polydipsia, nocturia and polyphagia. She denies nausea or diarrhea.   Care Team:   Patient Care Team:  Joellen Jersey, MD as PCP - General (Internal Medicine)      Hospitalizations:   Hospitalization within past year: [x]  No  []  Yes     Diagnosis:      Screenings:   Ambulatory Screening Tools 01/26/2019 01/26/2019 02/06/2020   Falls Risk: [place] more than 2 times in past year N N N   Falls Risk: Suffer any injuries? N N N   Depression: PHQ2 Total Score - - 0   Depression: PHQ9 Total Score - - 0        Substance Use Disorder Screen:  In the past year, how often have you used the following?  1) Alcohol (For men, 5 or more drinks a day. For women, 4 or more drinks a day)  [x]  Never []  Once or Twice []  Monthly []   Weekly []  Daily or Almost Daily  2) Tobacco Products  [x]  Never []  Once or Twice []  Monthly []  Weekly []  Daily or Almost Daily  3) Prescription Drugs for Non-Medical Reasons  [x]  Never []  Once or Twice []  Monthly []  Weekly []  Daily or Almost Daily  4) Illegal Drugs  [x]  Never []  Once or Twice []  Monthly []  Weekly []  Daily or Almost Daily       Functional Ability/Level of Safety:   Falls Risk/Home Safety Assessment:  ( see HRA and Screenings sections for additional assessment)  Home Safety: [x]  Stair handrails  []  Skid-resistant rugs/remove throw rugs   []  Grab bars  []  Clear pathways between rooms  []  Proper lighting stairs/ bathrooms/bedrooms  Get Up and Go (optional):  [x]   <20 secs  []   >20 secs    []   High risk for falls - Home Safety/Falls Risk Precautions reviewed with pt/family    Hearing Assessment:  Concerns for  hearing loss: []  Yes  [x]   No  Hearing aids:   []   Right  []   Left  []   Bilateral   [x]   None  Whisper Test (optional):  [x]  Normal  []   Slightly decreased  []   Significantly decreased    Exercise:  Frequency:  []   No formal exercise  []   1-2x/wk  [x]   3-4x/wk  []   >4x/wk  Duration:  [x]   15-30 mins/day  []   30-45 mins/day  []   45+ mins/day  Intensity:  [x]   Light  []   Moderate  []   Heavy        Activities of Daily Living:   ADL's Independent Minimal  Assistance Moderate  Assistance Total   Assistance   Bathing [x]  []  []  []    Dressing [x]  []  []  []    Mobility   [x]  []  []  []    Transfer [x]  []  []  []    Eating [x]  []  []  []    Toileting [x]  []  []  []      IADL's Independent Minimal  Assistance Moderate  Assistance Total   Assistance   Phone [x]  []  []  []    Housekeeping [x]  []  []  []    Laundry [x]  []  []  []    Transportation [x]  []  []  []    Medications [x]  []  []  []    Finances [x]  []  []  []       ADL assistance: [x]  No assistance needed  []  Spouse  []  Sibling  []  Son   []  Daughter []  Children  []  Home Health Aide []  Other:       Advance Care Planning:   Discussion of Advance Directives:   []  Advance Directive in chart  [x]  Advance Directive not in chart - requested to provide []  No Advance Directive.  Form Provided  []  No Advance Directive.  Pt declines. []  Not addressed today  []  Other:     Exam:   BP 116/74 (BP Site: Left arm, Patient Position: Sitting, Cuff Size: Small)   Pulse 78   Temp 97.8 F (36.6 C) (Temporal)   Resp 18   Ht 1.524 m (5')   Wt 65 kg (143 lb 6.4 oz)   SpO2 98%   BMI 28.01 kg/m      Physical Exam  Vitals reviewed.   Constitutional:       Appearance: Normal appearance. She is normal weight.   HENT:      Head: Normocephalic and atraumatic.   Eyes:      Conjunctiva/sclera: Conjunctivae normal.   Cardiovascular:      Rate and  Rhythm: Normal rate and regular rhythm.      Heart sounds: Normal heart sounds.   Pulmonary:      Effort: Pulmonary effort is normal.      Breath sounds: Normal breath sounds.    Musculoskeletal:      Right lower leg: No edema.      Left lower leg: No edema.   Neurological:      General: No focal deficit present.      Mental Status: She is alert.   Psychiatric:         Mood and Affect: Mood normal.         Behavior: Behavior normal.               Evaluation of Cognitive Function:   Mood/affect: [x]  Appropriate  []   Other:   Appearance: [x]  Neatly groomed  [x]  Adequately nourished  []  Other:  Family member/caregiver input: []  Present - no concerns  []   Not present in room  []  Present - concerns:    Cognitive Assessment:  Mini-Cog Result (three word registration- banana, sunrise, chair / clock drawing):   [x]   > 3 points - negative screen for dementia   []  3 recalled words - negative screen for dementia   []  1-2 recalled words and normal clock draw - negative for cognitive impairment   []  1-2 recalled words and abnormal clock draw - positive for cognitive impairment   []  0 recalled words - positive for cognitive impairment         Assessment/Plan:   1. IGT (impaired glucose tolerance)  - metFORMIN (GLUCOPHAGE) 500 MG tablet; Take 0.5 tablets (250 mg total) by mouth 2 (two) times daily With food  Dispense: 180 tablet; Refill: 4  - Comprehensive metabolic panel  - Hemoglobin A1C    2. Mixed hyperlipidemia  - Lipid panel  - Comprehensive metabolic panel    3. Spondylosis of lumbar spine  - gabapentin (NEURONTIN) 600 MG tablet; TAKE ONE TABLET BY MOUTH EVERY MORNING AND THREE TABLETS AT NIGHT  Dispense: 120 tablet; Refill: 1    4. Adult situational stress disorder  - FLUoxetine (PROzac) 40 MG capsule; Take 1 capsule (40 mg total) by mouth daily  Dispense: 30 capsule; Refill: 3  - zolpidem (AMBIEN CR) 6.25 MG CR tablet; Take 1 tablet (6.25 mg total) by mouth nightly as needed (as needed)  Dispense: 20 tablet; Refill: 1    5. Major depressive disorder, recurrent episode, mild    6. Screening for colon cancer  - Cologuard    7. Screening for viral disease  - Hepatitis C (HCV) antibody,  Total      Education/Counseling:  During the course of the visit, the patient was educated and counseled about appropriate screening and preventive services including:   Influenza vaccine  Colorectal cancer screening      Falls Risk/Home Safety:  The following falls risk/home safety measures were discussed with the patient:  [x]  Home Environment - 1) clear floors, walkways, and stairs of items 2) Remove throw rugs or use non-skid rugs 3) Install grab bars in bathroom 4) Use of handrails with stairs 5) Proper lighting of hallways, bathroom, stairs, bedroom  [x]  Physical Activity - age appropriate activity/exercise 150 mins per week.  [x]  Nutrition - adequate hydration, limit soda, coffee, alcohol   [x]  Vision - yearly eye exam, keep glasses clean  [x]  Medications - side effect education           [place], [place]  02/06/2020    The following sections were reviewed this encounter by the provider:   Tobacco  Allergies  Meds  Problems  Med Hx  Surg Hx  Fam Hx          History:   Patient Active Problem List   Diagnosis   . Abnormal EKG   . Abnormal SPEP   . Adult situational stress disorder   . Benign thyroid cyst   . Cataract, acquired   . Chronic gastritis without bleeding, unspecified gastritis type   . Coronary artery calcification seen on CAT scan   . Elevated parathyroid hormone   . High potassium   . History of gastric bypass   . IgG deficiency   . IGT (impaired glucose tolerance)   . Low magnesium level   . Multiple allergies   . Notalgia paresthetica   . Osteopenia of left lower leg   . Pulmonary nodule   . Spondylosis of lumbar spine   . Hyperlipidemia   . Thyroid nodule   . Major depressive disorder, recurrent episode, mild      Past Medical History:   Diagnosis Date   . Hyperlipidemia      Past Surgical History:   Procedure Laterality Date   . ARTHROSCOPY, KNEE Right 1980   . BREAST BIOPSY Left 1996   . CATARACT EXTRACTION, BILATERAL  2019   . CHOLECYSTECTOMY  2011   . CHOLECYSTECTOMY  2016   .  GASTRIC BYPASS  11/29/2002   . REDUCTION MAMMAPLASTY  12/2011   . ROTATOR CUFF REPAIR Right      Allergies   Allergen Reactions   . Bee Pollen Anaphylaxis     Carries epi pens   . Bee Venom    . Kiwi Extract    . Wound Dressing Adhesive Itching and Rash      Outpatient Medications Marked as Taking for the 02/06/20 encounter (Office Visit) with Janae Bridgeman, PA   Medication Sig Dispense Refill   . alendronate (FOSAMAX) 70 MG tablet TAKE ONE TABLET PER WEEK 12 tablet 3   . atorvastatin (LIPITOR) 10 MG tablet TAKE ONE TABLET BY MOUTH EVERY OTHER DAY 45 tablet 4   . calcium carbonate-vitamin D (Calcium 600+D) 600-400 MG-UNIT per tablet Take 1 tablet by mouth 2 (two) times daily     . Cyanocobalamin (VITAMIN B 12 PO) Take 500 mcg by mouth daily     . fluticasone (FLONASE) 50 MCG/ACT nasal spray 2 sprays by Nasal route daily     . MAGNESIUM OXIDE PO Take 500 mg by mouth daily     . Multiple Vitamins-Minerals (multivitamin with minerals) tablet Take 1 tablet by mouth daily     . pantoprazole (PROTONIX) 40 MG tablet TAKE ONE TABLET BY MOUTH EVERY DAY AS NEEDED 90 tablet 1   . pseudoephedrine (Sudafed 12 Hour) 120 MG 12 hr tablet Take 1 tablet by mouth every 12 (twelve) hours Prn congestion     . vitamin D (CHOLECALCIFEROL) 25 MCG (1000 UT) tablet Take 1 tablet by mouth every other day       . [DISCONTINUED] metFORMIN (GLUCOPHAGE) 500 MG tablet Take 0.5 tablets (250 mg total) by mouth 2 (two) times daily With food 180 tablet 4     Social History     Tobacco Use   . Smoking status: Former Smoker     Packs/day: 0.50     Years: 34.00     Pack years: 17.00     Quit date: 2004  Years since quitting: 17.6   . Smokeless tobacco: Never Used   Vaping Use   . Vaping Use: Never used   Substance Use Topics   . Alcohol use: Yes     Comment: occasional   . Drug use: Never      Family History   Problem Relation Age of Onset   . Hyperlipidemia Mother    . Breast cancer Mother    . Thyroid disease Mother    . Stroke Father 27   .  Thyroid disease Sister    . Lymphoma Maternal Grandfather    . Diabetes Other    . Breast cancer Maternal Aunt            ===================================================================    Additional Documentation:     Chief Complaint:           HPI:             ROS:             Physical Exam:           Assessment/Plan:   1. IGT (impaired glucose tolerance)  - metFORMIN (GLUCOPHAGE) 500 MG tablet; Take 0.5 tablets (250 mg total) by mouth 2 (two) times daily With food  Dispense: 180 tablet; Refill: 4  - Comprehensive metabolic panel  - Hemoglobin A1C    2. Mixed hyperlipidemia  - Lipid panel  - Comprehensive metabolic panel    3. Spondylosis of lumbar spine  - gabapentin (NEURONTIN) 600 MG tablet; TAKE ONE TABLET BY MOUTH EVERY MORNING AND THREE TABLETS AT NIGHT  Dispense: 120 tablet; Refill: 1    4. Adult situational stress disorder  - FLUoxetine (PROzac) 40 MG capsule; Take 1 capsule (40 mg total) by mouth daily  Dispense: 30 capsule; Refill: 3  - zolpidem (AMBIEN CR) 6.25 MG CR tablet; Take 1 tablet (6.25 mg total) by mouth nightly as needed (as needed)  Dispense: 20 tablet; Refill: 1    5. Major depressive disorder, recurrent episode, mild    6. Screening for colon cancer  - Cologuard    7. Screening for viral disease  - Hepatitis C (HCV) antibody, Total      1.   LIPIDS  Lab Results   Component Value Date    CHOL 150 01/26/2019    CHOL 133 01/20/2018    TRIG 96 01/26/2019    TRIG 118 01/20/2018    TRIG 128 01/12/2017    HDL 50 01/26/2019    HDL 46 01/20/2018    LDL 81 01/26/2019    LDL 63 01/20/2018    AST 18 01/26/2019    ALT 18 01/26/2019     Recommend patient consume a healthy diet that emphasizes the intake of vegetables, fruits, nuts, whole grains, lean vegetable or animal protein, and fish and minimizes the intake of trans fats, red meat and process meats, refined carbohydrates, and sugar sweetened beverages.  Recommend patient engage in at least 150 minutes per week of accumulated moderate-intensity  physical activity or 75 minutes per week of vigorous-intensity physical activity.  Prior LDL values within acceptable limits on appropriate intensity statin medication.  Continue current medication therapy.  Obtain lipid indices today.  Continue to optimize a heart healthy diet and aerobic exercise efforts.    2. PREDIABETES  High BMI follow-up: Encouragement to Exercise. Prior A1C elevated at prediabetic levels.  Continue to optimize low carbohydrate diet and aerobic exercise measures.  Obtain A1C today. A1C have been well controlled on meds. She will  continue the metformin.     3. Medications for depression and sleep refilled for pt today. Her symptoms are well controlled on meds.     Follow-up:   Return in about 6 months (around 08/08/2020) for Routine follow up.       Candice Danie Binder, [place]

## 2020-02-07 LAB — HEMOGLOBIN A1C
Average Estimated Glucose: 102.5 mg/dL
Hemoglobin A1C: 5.2 % (ref 4.6–5.9)

## 2020-02-07 LAB — HEPATITIS C ANTIBODY: Hepatitis C, AB: NONREACTIVE

## 2020-02-22 ENCOUNTER — Other Ambulatory Visit (INDEPENDENT_AMBULATORY_CARE_PROVIDER_SITE_OTHER): Payer: Self-pay

## 2020-02-22 DIAGNOSIS — F4320 Adjustment disorder, unspecified: Secondary | ICD-10-CM

## 2020-03-19 ENCOUNTER — Other Ambulatory Visit (INDEPENDENT_AMBULATORY_CARE_PROVIDER_SITE_OTHER): Payer: Self-pay | Admitting: Internal Medicine

## 2020-03-19 DIAGNOSIS — M47816 Spondylosis without myelopathy or radiculopathy, lumbar region: Secondary | ICD-10-CM

## 2020-03-22 ENCOUNTER — Other Ambulatory Visit (INDEPENDENT_AMBULATORY_CARE_PROVIDER_SITE_OTHER): Payer: Self-pay | Admitting: Internal Medicine

## 2020-03-22 DIAGNOSIS — M47816 Spondylosis without myelopathy or radiculopathy, lumbar region: Secondary | ICD-10-CM

## 2020-03-30 ENCOUNTER — Encounter (INDEPENDENT_AMBULATORY_CARE_PROVIDER_SITE_OTHER): Payer: Self-pay

## 2020-05-10 ENCOUNTER — Other Ambulatory Visit (INDEPENDENT_AMBULATORY_CARE_PROVIDER_SITE_OTHER): Payer: Self-pay

## 2020-05-10 DIAGNOSIS — F4320 Adjustment disorder, unspecified: Secondary | ICD-10-CM

## 2020-05-19 ENCOUNTER — Other Ambulatory Visit (INDEPENDENT_AMBULATORY_CARE_PROVIDER_SITE_OTHER): Payer: Self-pay

## 2020-05-19 DIAGNOSIS — R7302 Impaired glucose tolerance (oral): Secondary | ICD-10-CM

## 2020-05-20 ENCOUNTER — Other Ambulatory Visit (INDEPENDENT_AMBULATORY_CARE_PROVIDER_SITE_OTHER): Payer: Self-pay | Admitting: Physician Assistant

## 2020-05-20 DIAGNOSIS — M47816 Spondylosis without myelopathy or radiculopathy, lumbar region: Secondary | ICD-10-CM

## 2020-05-21 ENCOUNTER — Other Ambulatory Visit (INDEPENDENT_AMBULATORY_CARE_PROVIDER_SITE_OTHER): Payer: Self-pay | Admitting: Internal Medicine

## 2020-05-21 DIAGNOSIS — M47816 Spondylosis without myelopathy or radiculopathy, lumbar region: Secondary | ICD-10-CM

## 2020-05-22 ENCOUNTER — Telehealth (INDEPENDENT_AMBULATORY_CARE_PROVIDER_SITE_OTHER): Payer: Self-pay | Admitting: Geriatric Medicine

## 2020-05-22 ENCOUNTER — Encounter (INDEPENDENT_AMBULATORY_CARE_PROVIDER_SITE_OTHER): Payer: Self-pay | Admitting: Physician Assistant

## 2020-05-22 DIAGNOSIS — M47816 Spondylosis without myelopathy or radiculopathy, lumbar region: Secondary | ICD-10-CM

## 2020-05-22 NOTE — Telephone Encounter (Signed)
S/w patient asking for medication refill of her Gabapentin. She had already used her refills.

## 2020-05-22 NOTE — Telephone Encounter (Signed)
This patient is requesting a refill of this medication.  If this cannot be done, please call the patient on her mobile #.    Thanks  [employer]

## 2020-05-23 ENCOUNTER — Other Ambulatory Visit (INDEPENDENT_AMBULATORY_CARE_PROVIDER_SITE_OTHER): Payer: Self-pay | Admitting: Physician Assistant

## 2020-05-23 DIAGNOSIS — M47816 Spondylosis without myelopathy or radiculopathy, lumbar region: Secondary | ICD-10-CM

## 2020-05-23 MED ORDER — GABAPENTIN 600 MG PO TABS
ORAL_TABLET | ORAL | 2 refills | Status: DC
Start: 2020-05-23 — End: 2020-08-22

## 2020-05-23 NOTE — Progress Notes (Signed)
No answer.  Left message that I did refill her meds , confirmed dosing in last visit in August, not sure why they just did a 2 month supply at that point.  PMP database checked and is not filling early.  Appropriate to refill.  3 month supply done and will need to set up a cpx with a new provider to take over her care at that time.

## 2020-05-23 NOTE — Telephone Encounter (Signed)
Addressed earlier. Done.

## 2020-05-25 ENCOUNTER — Telehealth (INDEPENDENT_AMBULATORY_CARE_PROVIDER_SITE_OTHER): Payer: Self-pay | Admitting: Geriatric Medicine

## 2020-05-25 NOTE — Telephone Encounter (Signed)
S/w patient, addressed concern.

## 2020-05-25 NOTE — Telephone Encounter (Signed)
[facility] is calling for refill of this patient's Gabapentin.    Please call [phone].    Thanks  [employer]

## 2020-06-01 ENCOUNTER — Telehealth (INDEPENDENT_AMBULATORY_CARE_PROVIDER_SITE_OTHER): Payer: Self-pay | Admitting: Geriatric Medicine

## 2020-06-01 DIAGNOSIS — M85862 Other specified disorders of bone density and structure, left lower leg: Secondary | ICD-10-CM

## 2020-06-01 MED ORDER — ALENDRONATE SODIUM 70 MG PO TABS
70.0000 mg | ORAL_TABLET | ORAL | 3 refills | Status: DC
Start: 2020-06-01 — End: 2020-12-07

## 2020-06-01 NOTE — Telephone Encounter (Signed)
Alendronate 70mg  #12

## 2020-06-01 NOTE — Telephone Encounter (Signed)
Filled 07/02/19 for 3 month supply with refillx3    S/w pharmacy- Per pharmacy she has no refill.

## 2020-07-06 ENCOUNTER — Encounter (INDEPENDENT_AMBULATORY_CARE_PROVIDER_SITE_OTHER): Payer: Self-pay | Admitting: Geriatric Medicine

## 2020-07-11 ENCOUNTER — Ambulatory Visit (INDEPENDENT_AMBULATORY_CARE_PROVIDER_SITE_OTHER): Payer: Medicare Other | Admitting: Internal Medicine

## 2020-07-11 ENCOUNTER — Encounter (INDEPENDENT_AMBULATORY_CARE_PROVIDER_SITE_OTHER): Payer: Self-pay | Admitting: Internal Medicine

## 2020-07-11 VITALS — BP 110/82 | HR 80 | Temp 96.9°F | Ht 60.0 in | Wt 152.0 lb

## 2020-07-11 DIAGNOSIS — M7061 Trochanteric bursitis, right hip: Secondary | ICD-10-CM

## 2020-07-11 DIAGNOSIS — M545 Low back pain, unspecified: Secondary | ICD-10-CM

## 2020-07-11 DIAGNOSIS — F330 Major depressive disorder, recurrent, mild: Secondary | ICD-10-CM

## 2020-07-11 DIAGNOSIS — G8929 Other chronic pain: Secondary | ICD-10-CM

## 2020-07-11 DIAGNOSIS — J029 Acute pharyngitis, unspecified: Secondary | ICD-10-CM

## 2020-07-11 DIAGNOSIS — R0981 Nasal congestion: Secondary | ICD-10-CM

## 2020-07-11 MED ORDER — MONTELUKAST SODIUM 10 MG PO TABS
10.0000 mg | ORAL_TABLET | Freq: Every day | ORAL | 0 refills | Status: DC
Start: 2020-07-11 — End: 2020-10-09

## 2020-07-11 MED ORDER — FLUOXETINE HCL 20 MG PO CAPS
20.0000 mg | ORAL_CAPSULE | Freq: Every day | ORAL | 0 refills | Status: DC
Start: 2020-07-11 — End: 2020-10-09

## 2020-07-11 NOTE — Progress Notes (Signed)
No recent travels, no cough, no fever, no SOB, no Covid testing, no known Covid exposure, no visit to a nursing homes.

## 2020-07-11 NOTE — Progress Notes (Signed)
[facility] - AN [place] PARTNER                  Date of Exam: 07/11/2020 2:59 PM        Patient ID: Courtney Solis is a 75 y.o. female.        Chief Complaint:    Chief Complaint   Patient presents with    Hip & Back Pain     gabapentin     Establish Care    PT referral     dry needling     Sore Throat     x 2 weeks             HPI:    HPI  Pt has chronic low back pain which was managed by pain specialist Dr. Penni Bombard. Pt had 2 RF treatments. Pt is taking gabapentin but it does not completely control pain.    Next, pt requests PT referral for right hip bursitis which has returned.    Next, pt c/o depression and insomnia d/t pandemic. Denies SI/HI.    Next, pt c/o ST, PND, and sinus congestion x 2 weeks. COVID test neg. Denies fever, chills, sweats, cough, sob.                Problem List:    Patient Active Problem List   Diagnosis    Abnormal EKG    Abnormal SPEP    Adult situational stress disorder    Benign thyroid cyst    Cataract, acquired    Chronic gastritis without bleeding, unspecified gastritis type    Coronary artery calcification seen on CAT scan    Elevated parathyroid hormone    High potassium    History of gastric bypass    IgG deficiency    IGT (impaired glucose tolerance)    Low magnesium level    Multiple allergies    Notalgia paresthetica    Osteopenia of left lower leg    Pulmonary nodule    Spondylosis of lumbar spine    Hyperlipidemia    Thyroid nodule    Major depressive disorder, recurrent episode, mild             Current Meds:    Outpatient Medications Marked as Taking for the 07/11/20 encounter (Office Visit) with Renaldo Reel, MD   Medication Sig Dispense Refill    alendronate (FOSAMAX) 70 MG tablet Take 1 tablet (70 mg total) by mouth once a week Take with a full glass of water on an empty stomach. Do not take anything else by mouth or lie down for 30 mins. 12 tablet 3    atorvastatin (LIPITOR) 10 MG tablet TAKE ONE TABLET BY MOUTH EVERY  OTHER DAY 45 tablet 4    calcium carbonate-vitamin D (Calcium 600+D) 600-400 MG-UNIT per tablet Take 1 tablet by mouth 2 (two) times daily      Cyanocobalamin (VITAMIN B 12 PO) Take 500 mcg by mouth daily      FLUoxetine (PROzac) 40 MG capsule TAKE ONE CAPSULE BY MOUTH EVERY DAY 90 capsule 3    gabapentin (NEURONTIN) 600 MG tablet TAKE ONE TABLET BY MOUTH EVERY MORNING AND THREE TABLETS AT NIGHT 120 tablet 2    MAGNESIUM OXIDE PO Take 500 mg by mouth daily      metFORMIN (GLUCOPHAGE) 500 MG tablet Take 0.5 tablets (250 mg total) by mouth 2 (two) times daily With food 180 tablet 4    Multiple Vitamins-Minerals (multivitamin with minerals) tablet Take 1  tablet by mouth daily      pantoprazole (PROTONIX) 40 MG tablet TAKE ONE TABLET BY MOUTH EVERY DAY AS NEEDED 90 tablet 1    vitamin D (CHOLECALCIFEROL) 25 MCG (1000 UT) tablet Take 1 tablet by mouth every other day        zolpidem (AMBIEN CR) 6.25 MG CR tablet Take 1 tablet (6.25 mg total) by mouth nightly as needed (as needed) 20 tablet 1          Allergies:    Allergies   Allergen Reactions    Bee Pollen Anaphylaxis     Carries epi pens  Carries epi pens  Carries epi pens    Kiwi Extract Anaphylaxis    Bee Venom     Wound Dressing Adhesive Itching and Rash              Social History:    Social History     Tobacco Use    Smoking status: Former Smoker     Packs/day: 0.50     Years: 34.00     Pack years: 17.00     Quit date: 2004     Years since quitting: 18.0    Smokeless tobacco: Never Used   Haematologist Use: Never used   Substance Use Topics    Alcohol use: Yes     Comment: occasional    Drug use: Never           The following sections were reviewed this encounter by the provider:   Allergies   Meds              Vital Signs:    Vitals:    07/11/20 1253   BP: 110/82   Pulse: 80   Temp: (!) 96.9 F (36.1 C)   TempSrc: Temporal   SpO2: 97%   Weight: 68.9 kg (152 lb)   Height: 1.524 m (5')             ROS:    Review of Systems   See HPI.         Physical Exam:    Physical Exam  Constitutional:       Appearance: Normal appearance.   HENT:      Head: Normocephalic and atraumatic.      Right Ear: Tympanic membrane and ear canal normal.      Left Ear: Tympanic membrane and ear canal normal.      Nose: Congestion present.      Mouth/Throat:      Pharynx: Oropharynx is clear. No oropharyngeal exudate or posterior oropharyngeal erythema.   Eyes:      General: No scleral icterus.     Extraocular Movements: Extraocular movements intact.      Conjunctiva/sclera: Conjunctivae normal.      Pupils: Pupils are equal, round, and reactive to light.   Cardiovascular:      Rate and Rhythm: Normal rate and regular rhythm.      Heart sounds: Normal heart sounds.   Pulmonary:      Effort: Pulmonary effort is normal.      Breath sounds: Normal breath sounds.   Musculoskeletal:      Cervical back: Neck supple.      Comments: +ttp right trochanteric bursa.  +ttp lumbar spine.   Lymphadenopathy:      Cervical: No cervical adenopathy.   Neurological:      Mental Status: She is alert.      Cranial Nerves: No cranial nerve  deficit.      Motor: No weakness.      Coordination: Coordination normal.      Gait: Gait normal.      Deep Tendon Reflexes: Reflexes normal.              Assessment/Plan:    1. Greater trochanteric bursitis of right hip  - Referral to Physical Therapy - EXTERNAL    2. Chronic midline low back pain without sciatica  - cont gabapentin  - lidoderm patch to back 12 hrs daily  - f/u with chronic pain specialist, Dr. Penni Bombard    3. Major depressive disorder, recurrent episode, mild  Not currently controlled. Denies SI/HI.  - incr prozac to 60 mg daily  - FLUoxetine (PROzac) 20 MG capsule; Take 1 capsule (20 mg total) by mouth daily  Dispense: 90 capsule; Refill: 0 to be taken with 40 mg capsule that pt is currently taking.    4. Sinus congestion  D/t AR   - irrigate sinus with NS qid  - start antihistamine such as zyrtec, allegra, or claritin daily  - start montelukast  (SINGULAIR) 10 MG tablet; Take 1 tablet (10 mg total) by mouth daily  Dispense: 90 tablet; Refill: 0    5. Sore throat  D/t PND, AR  - irrigate sinus with NS qid  - start antihistamine such as zyrtec, allegra, or claritin  - start montelukast (SINGULAIR) 10 MG tablet  - Throat culture    I spent 34 min with pt.           Follow-up:    Return in about 6 weeks (around 08/22/2020).         Renaldo Reel, MD

## 2020-08-08 ENCOUNTER — Encounter (INDEPENDENT_AMBULATORY_CARE_PROVIDER_SITE_OTHER): Payer: Self-pay | Admitting: Internal Medicine

## 2020-08-20 ENCOUNTER — Other Ambulatory Visit (INDEPENDENT_AMBULATORY_CARE_PROVIDER_SITE_OTHER): Payer: Self-pay | Admitting: Physician Assistant

## 2020-08-20 ENCOUNTER — Ambulatory Visit (INDEPENDENT_AMBULATORY_CARE_PROVIDER_SITE_OTHER): Payer: Medicare Other | Admitting: Internal Medicine

## 2020-08-20 ENCOUNTER — Encounter (INDEPENDENT_AMBULATORY_CARE_PROVIDER_SITE_OTHER): Payer: Self-pay | Admitting: Internal Medicine

## 2020-08-20 VITALS — BP 120/70 | HR 79 | Temp 97.0°F | Ht 60.0 in | Wt 151.0 lb

## 2020-08-20 DIAGNOSIS — J301 Allergic rhinitis due to pollen: Secondary | ICD-10-CM

## 2020-08-20 DIAGNOSIS — M545 Low back pain, unspecified: Secondary | ICD-10-CM

## 2020-08-20 DIAGNOSIS — E782 Mixed hyperlipidemia: Secondary | ICD-10-CM

## 2020-08-20 DIAGNOSIS — M47816 Spondylosis without myelopathy or radiculopathy, lumbar region: Secondary | ICD-10-CM

## 2020-08-20 DIAGNOSIS — G8929 Other chronic pain: Secondary | ICD-10-CM

## 2020-08-20 DIAGNOSIS — F330 Major depressive disorder, recurrent, mild: Secondary | ICD-10-CM

## 2020-08-20 LAB — COMPREHENSIVE METABOLIC PANEL
ALT: 21 U/L (ref 0–55)
AST (SGOT): 18 U/L (ref 5–34)
Albumin/Globulin Ratio: 1.6 (ref 0.9–2.2)
Albumin: 3.9 g/dL (ref 3.5–5.0)
Alkaline Phosphatase: 37 U/L (ref 37–117)
Anion Gap: 6 (ref 5.0–15.0)
BUN: 21 mg/dL — ABNORMAL HIGH (ref 7.0–19.0)
Bilirubin, Total: 0.4 mg/dL (ref 0.2–1.2)
CO2: 27 mEq/L (ref 21–29)
Calcium: 8.9 mg/dL (ref 7.9–10.2)
Chloride: 106 mEq/L (ref 100–111)
Creatinine: 0.8 mg/dL (ref 0.4–1.5)
Globulin: 2.5 g/dL (ref 2.0–3.7)
Glucose: 108 mg/dL — ABNORMAL HIGH (ref 70–100)
Potassium: 5 mEq/L (ref 3.5–5.1)
Protein, Total: 6.4 g/dL (ref 6.0–8.3)
Sodium: 139 mEq/L (ref 136–145)

## 2020-08-20 LAB — HEMOLYSIS INDEX: Hemolysis Index: 4 (ref 0–24)

## 2020-08-20 LAB — GFR: EGFR: >60

## 2020-08-20 NOTE — Progress Notes (Signed)
[facility] INTERNAL MEDICINE - AN [place] PARTNER                  Date of Exam: 08/20/2020 4:51 PM        Patient ID: Courtney Solis is a 75 y.o. female.        Chief Complaint:    Chief Complaint   Patient presents with   . Follow-up     on medication   . Multiple issues             HPI:    HPI   Pt went to PT and received dry needling x 2 to right trochanteric area which has nearly resolved right low back pain.    Pt returned to [facility] and was offered implantable spinal cord stimulator for chronic LBP, but pt declined since her pain is fairly well controlled at this time. Pt will continue to get gabapentin refills from them. Back pain is better since starting lidoderm patch.    Next, pt is feeling much better since incr prozac to 60 mg daily. Depression is currently controlled.    Next, allergies have improved a lot with adding singulair.                Problem List:    Patient Active Problem List   Diagnosis   . Abnormal EKG   . Abnormal SPEP   . Adult situational stress disorder   . Benign thyroid cyst   . Cataract, acquired   . Chronic gastritis without bleeding, unspecified gastritis type   . Coronary artery calcification seen on CAT scan   . Elevated parathyroid hormone   . High potassium   . History of gastric bypass   . IgG deficiency   . IGT (impaired glucose tolerance)   . Low magnesium level   . Multiple allergies   . Notalgia paresthetica   . Osteopenia of left lower leg   . Pulmonary nodule   . Spondylosis of lumbar spine   . Hyperlipidemia   . Thyroid nodule   . Major depressive disorder, recurrent episode, mild             Current Meds:    Outpatient Medications Marked as Taking for the 08/20/20 encounter (Office Visit) with Renaldo Reel, MD   Medication Sig Dispense Refill   . alendronate (FOSAMAX) 70 MG tablet Take 1 tablet (70 mg total) by mouth once a week Take with a full glass of water on an empty stomach. Do not take anything else by mouth or lie down for 30  mins. 12 tablet 3   . atorvastatin (LIPITOR) 10 MG tablet TAKE ONE TABLET BY MOUTH EVERY OTHER DAY 45 tablet 4   . calcium carbonate-vitamin D (Calcium 600+D) 600-400 MG-UNIT per tablet Take 1 tablet by mouth 2 (two) times daily     . Cyanocobalamin (VITAMIN B 12 PO) Take 500 mcg by mouth daily     . FLUoxetine (PROzac) 20 MG capsule Take 1 capsule (20 mg total) by mouth daily 90 capsule 0   . FLUoxetine (PROzac) 40 MG capsule TAKE ONE CAPSULE BY MOUTH EVERY DAY 90 capsule 3   . fluticasone (FLONASE) 50 MCG/ACT nasal spray 2 sprays by Nasal route daily     . gabapentin (NEURONTIN) 600 MG tablet TAKE ONE TABLET BY MOUTH EVERY MORNING AND THREE TABLETS AT NIGHT 120 tablet 2   . ibuprofen (ADVIL) 400 MG tablet Take 400 mg by mouth every 6 (  six) hours as needed     . MAGNESIUM OXIDE PO Take 500 mg by mouth daily     . metFORMIN (GLUCOPHAGE) 500 MG tablet Take 0.5 tablets (250 mg total) by mouth 2 (two) times daily With food 180 tablet 4   . montelukast (SINGULAIR) 10 MG tablet Take 1 tablet (10 mg total) by mouth daily 90 tablet 0   . Multiple Vitamins-Minerals (multivitamin with minerals) tablet Take 1 tablet by mouth daily     . pantoprazole (PROTONIX) 40 MG tablet TAKE ONE TABLET BY MOUTH EVERY DAY AS NEEDED 90 tablet 1   . pseudoephedrine (Sudafed 12 Hour) 120 MG 12 hr tablet Take 1 tablet by mouth every 12 (twelve) hours Prn congestion     . vitamin D (CHOLECALCIFEROL) 25 MCG (1000 UT) tablet Take 1 tablet by mouth every other day       . zolpidem (AMBIEN CR) 6.25 MG CR tablet Take 1 tablet (6.25 mg total) by mouth nightly as needed (as needed) 20 tablet 1          Allergies:    Allergies   Allergen Reactions   . Bee Pollen Anaphylaxis     Carries epi pens  Carries epi pens  Carries epi pens   . Kiwi Extract Anaphylaxis   . Bee Venom    . Wound Dressing Adhesive Itching and Rash              Social History:    Social History     Tobacco Use   . Smoking status: Former Smoker     Packs/day: 0.50     Years: 34.00      Pack years: 17.00     Quit date: 2004     Years since quitting: 18.1   . Smokeless tobacco: Never Used   Vaping Use   . Vaping Use: Never used   Substance Use Topics   . Alcohol use: Yes     Comment: occasional   . Drug use: Never            The following sections were reviewed this encounter by the provider: Meds               Vital Signs:    Vitals:    08/20/20 1418   BP: 120/70   Pulse: 79   Temp: 97 F (36.1 C)   SpO2: 97%   Weight: 68.5 kg (151 lb)   Height: 1.524 m (5')             ROS:    Review of Systems   Constitutional: Negative for chills, diaphoresis and fever.   Respiratory: Negative for cough, chest tightness, shortness of breath and wheezing.    Cardiovascular: Negative for chest pain and palpitations.   Gastrointestinal: Negative for abdominal pain, diarrhea, nausea and vomiting.              Physical Exam:    Physical Exam  Constitutional:       General: She is not in acute distress.     Appearance: Normal appearance.   HENT:      Head: Normocephalic and atraumatic.   Neck:      Vascular: No carotid bruit.   Cardiovascular:      Rate and Rhythm: Normal rate and regular rhythm.      Pulses: Normal pulses.      Heart sounds: Normal heart sounds.   Pulmonary:      Effort: Pulmonary effort is normal. No  respiratory distress.      Breath sounds: Normal breath sounds.   Musculoskeletal:      Cervical back: Neck supple.      Comments: Spine-NT. Back-+min ttp right lumbar muscles. MS 5/5.   Neurological:      Mental Status: She is alert.              Assessment/Plan:    1. Chronic right-sided low back pain without sciatica  Currently controlled with dry needling, lidoderm patch, gabapentin  - cont current management.    2. Major depressive disorder, recurrent episode, mild  Controlled with incr prozac 60 mg daily. Denies SI/HI.  - Comprehensive metabolic panel    3. Seasonal allergic rhinitis due to pollen  Controlled with adding singulair  - irrigate sinuses with NS bid  - cont current meds                Follow-up:    Return in about 6 months (around 02/06/2021) for medicare wellness.         Renaldo Reel, MD

## 2020-09-09 ENCOUNTER — Other Ambulatory Visit (INDEPENDENT_AMBULATORY_CARE_PROVIDER_SITE_OTHER): Payer: Self-pay

## 2020-09-10 ENCOUNTER — Telehealth (INDEPENDENT_AMBULATORY_CARE_PROVIDER_SITE_OTHER): Payer: Self-pay | Admitting: Geriatric Medicine

## 2020-09-10 ENCOUNTER — Ambulatory Visit (INDEPENDENT_AMBULATORY_CARE_PROVIDER_SITE_OTHER): Payer: Medicare Other | Admitting: Physician Assistant

## 2020-09-10 MED ORDER — ATORVASTATIN CALCIUM 10 MG PO TABS
10.0000 mg | ORAL_TABLET | ORAL | 0 refills | Status: DC
Start: 2020-09-10 — End: 2020-12-07

## 2020-09-10 MED ORDER — PANTOPRAZOLE SODIUM 40 MG PO TBEC
40.0000 mg | DELAYED_RELEASE_TABLET | Freq: Every day | ORAL | 0 refills | Status: DC | PRN
Start: 2020-09-10 — End: 2020-12-07

## 2020-09-10 NOTE — Telephone Encounter (Signed)
Filled for 90 days, has scheduled appt 02/20/21

## 2020-09-10 NOTE — Telephone Encounter (Signed)
Sent, has scheduled appt on 02/20/21

## 2020-09-10 NOTE — Telephone Encounter (Signed)
Pharmacy faxed    Requesting an rx for atorvastatin, please send to [phone]

## 2020-10-09 ENCOUNTER — Other Ambulatory Visit (INDEPENDENT_AMBULATORY_CARE_PROVIDER_SITE_OTHER): Payer: Self-pay | Admitting: Internal Medicine

## 2020-10-09 DIAGNOSIS — R0981 Nasal congestion: Secondary | ICD-10-CM

## 2020-10-09 DIAGNOSIS — F330 Major depressive disorder, recurrent, mild: Secondary | ICD-10-CM

## 2020-11-01 ENCOUNTER — Encounter (INDEPENDENT_AMBULATORY_CARE_PROVIDER_SITE_OTHER): Payer: Self-pay | Admitting: Internal Medicine

## 2020-12-07 ENCOUNTER — Encounter (INDEPENDENT_AMBULATORY_CARE_PROVIDER_SITE_OTHER): Payer: Self-pay | Admitting: Internal Medicine

## 2020-12-07 ENCOUNTER — Ambulatory Visit (INDEPENDENT_AMBULATORY_CARE_PROVIDER_SITE_OTHER): Payer: Medicare Other | Admitting: Internal Medicine

## 2020-12-07 VITALS — BP 126/72 | HR 87 | Temp 97.7°F | Ht 60.0 in | Wt 157.0 lb

## 2020-12-07 DIAGNOSIS — R7302 Impaired glucose tolerance (oral): Secondary | ICD-10-CM

## 2020-12-07 DIAGNOSIS — F330 Major depressive disorder, recurrent, mild: Secondary | ICD-10-CM

## 2020-12-07 DIAGNOSIS — M47816 Spondylosis without myelopathy or radiculopathy, lumbar region: Secondary | ICD-10-CM

## 2020-12-07 DIAGNOSIS — R0981 Nasal congestion: Secondary | ICD-10-CM

## 2020-12-07 DIAGNOSIS — F5101 Primary insomnia: Secondary | ICD-10-CM

## 2020-12-07 DIAGNOSIS — E782 Mixed hyperlipidemia: Secondary | ICD-10-CM

## 2020-12-07 DIAGNOSIS — M85862 Other specified disorders of bone density and structure, left lower leg: Secondary | ICD-10-CM

## 2020-12-07 DIAGNOSIS — K2950 Unspecified chronic gastritis without bleeding: Secondary | ICD-10-CM

## 2020-12-07 MED ORDER — FLUOXETINE HCL 20 MG PO CAPS
20.0000 mg | ORAL_CAPSULE | Freq: Every day | ORAL | 1 refills | Status: DC
Start: 2020-12-07 — End: 2021-04-19

## 2020-12-07 MED ORDER — ALENDRONATE SODIUM 70 MG PO TABS
70.0000 mg | ORAL_TABLET | ORAL | 1 refills | Status: AC
Start: 2020-12-07 — End: ?

## 2020-12-07 MED ORDER — METFORMIN HCL 500 MG PO TABS
500.0000 mg | ORAL_TABLET | Freq: Two times a day (BID) | ORAL | 1 refills | Status: AC
Start: 2020-12-07 — End: ?

## 2020-12-07 MED ORDER — MONTELUKAST SODIUM 10 MG PO TABS
10.0000 mg | ORAL_TABLET | Freq: Every day | ORAL | 1 refills | Status: DC
Start: 2020-12-07 — End: 2021-04-19

## 2020-12-07 MED ORDER — ATORVASTATIN CALCIUM 10 MG PO TABS
10.0000 mg | ORAL_TABLET | ORAL | 1 refills | Status: DC
Start: 2020-12-07 — End: 2021-04-19

## 2020-12-07 MED ORDER — PANTOPRAZOLE SODIUM 40 MG PO TBEC
40.0000 mg | DELAYED_RELEASE_TABLET | Freq: Every day | ORAL | 0 refills | Status: DC | PRN
Start: 2020-12-07 — End: 2021-03-29

## 2020-12-07 MED ORDER — ZOLPIDEM TARTRATE ER 6.25 MG PO TBCR
6.2500 mg | EXTENDED_RELEASE_TABLET | Freq: Every evening | ORAL | 0 refills | Status: AC | PRN
Start: 2020-12-07 — End: ?

## 2020-12-07 MED ORDER — FLUOXETINE HCL 40 MG PO CAPS
40.0000 mg | ORAL_CAPSULE | Freq: Every day | ORAL | 1 refills | Status: AC
Start: 2020-12-07 — End: ?

## 2020-12-07 MED ORDER — GABAPENTIN 600 MG PO TABS
ORAL_TABLET | ORAL | 1 refills | Status: DC
Start: 2020-12-07 — End: 2021-01-29

## 2020-12-07 NOTE — Progress Notes (Signed)
[facility] INTERNAL MEDICINE - AN [place] PARTNER                  Date of Exam: 12/07/2020 2:29 PM        Patient ID: Courtney Solis is a 75 y.o. female.        Chief Complaint:    Chief Complaint   Patient presents with   . Hyperlipidemia     [place]   . Depression             HPI:    HPI   Pt is moving to [place] and needs all medicines refilled.  Pt is feeling good today.              Problem List:    Patient Active Problem List   Diagnosis   . Abnormal EKG   . Abnormal SPEP   . Adult situational stress disorder   . Benign thyroid cyst   . Cataract, acquired   . Chronic gastritis without bleeding, unspecified gastritis type   . Coronary artery calcification seen on CAT scan   . Elevated parathyroid hormone   . High potassium   . History of gastric bypass   . IgG deficiency   . IGT (impaired glucose tolerance)   . Low magnesium level   . Multiple allergies   . Notalgia paresthetica   . Osteopenia of left lower leg   . Pulmonary nodule   . Spondylosis of lumbar spine   . Hyperlipidemia   . Thyroid nodule   . Major depressive disorder, recurrent episode, mild             Current Meds:    Outpatient Medications Marked as Taking for the 12/07/20 encounter (Office Visit) with Renaldo Reel, MD   Medication Sig Dispense Refill   . alendronate (FOSAMAX) 70 MG tablet Take 1 tablet (70 mg total) by mouth once a week Take with a full glass of water on an empty stomach. Do not take anything else by mouth or lie down for 30 mins. 12 tablet 1   . atorvastatin (LIPITOR) 10 MG tablet Take 1 tablet (10 mg total) by mouth every other day 45 tablet 1   . calcium carbonate-vitamin D (Calcium 600+D) 600-400 MG-UNIT per tablet Take 1 tablet by mouth 2 (two) times daily     . Cyanocobalamin (VITAMIN B 12 PO) Take 500 mcg by mouth daily     . FLUoxetine (PROzac) 20 MG capsule Take 1 capsule (20 mg total) by mouth daily 90 capsule 1   . FLUoxetine (PROzac) 40 MG capsule Take 1 capsule (40 mg total) by mouth daily 90  capsule 1   . fluticasone (FLONASE) 50 MCG/ACT nasal spray 2 sprays by Nasal route daily     . gabapentin (NEURONTIN) 600 MG tablet TAKE 1 TABLET IN THE MORNING AND 3 TABLETS AT NIGHT 120 tablet 1   . lidocaine (XYLOCAINE) 5 % ointment APPLY A SMALL AMOUNT TO AFFECTED  AREA TWICE A DAY AS NEEDED.     Marland Kitchen MAGNESIUM OXIDE PO Take 500 mg by mouth daily     . metFORMIN (GLUCOPHAGE) 500 MG tablet Take 1 tablet (500 mg total) by mouth 2 (two) times daily With food 180 tablet 1   . montelukast (SINGULAIR) 10 MG tablet Take 1 tablet (10 mg total) by mouth daily 90 tablet 1   . Multiple Vitamins-Minerals (multivitamin with minerals) tablet Take 1 tablet by mouth daily     .  pantoprazole (PROTONIX) 40 MG tablet Take 1 tablet (40 mg total) by mouth daily as needed (daily as needed) 90 tablet 0   . vitamin D (CHOLECALCIFEROL) 25 MCG (1000 UT) tablet Take 1 tablet by mouth every other day       . zolpidem (AMBIEN CR) 6.25 MG CR tablet Take 1 tablet (6.25 mg total) by mouth nightly as needed (as needed) 20 tablet 0   . [DISCONTINUED] alendronate (FOSAMAX) 70 MG tablet Take 1 tablet (70 mg total) by mouth once a week Take with a full glass of water on an empty stomach. Do not take anything else by mouth or lie down for 30 mins. 12 tablet 3   . [DISCONTINUED] atorvastatin (LIPITOR) 10 MG tablet Take 1 tablet (10 mg total) by mouth every other day 45 tablet 0   . [DISCONTINUED] FLUoxetine (PROzac) 20 MG capsule TAKE ONE CAPSULE BY MOUTH EVERY DAY 90 capsule 0   . [DISCONTINUED] FLUoxetine (PROzac) 40 MG capsule TAKE ONE CAPSULE BY MOUTH EVERY DAY 90 capsule 3   . [DISCONTINUED] gabapentin (NEURONTIN) 600 MG tablet TAKE 1 TABLET IN THE MORNING AND 3 TABLETS AT NIGHT 120 tablet 1   . [DISCONTINUED] metFORMIN (GLUCOPHAGE) 500 MG tablet Take 0.5 tablets (250 mg total) by mouth 2 (two) times daily With food 180 tablet 4   . [DISCONTINUED] montelukast (SINGULAIR) 10 MG tablet TAKE ONE TABLET BY MOUTH EVERY DAY 90 tablet 0   . [DISCONTINUED]  pantoprazole (PROTONIX) 40 MG tablet Take 1 tablet (40 mg total) by mouth daily as needed (daily as needed) 90 tablet 0   . [DISCONTINUED] zolpidem (AMBIEN CR) 6.25 MG CR tablet Take 1 tablet (6.25 mg total) by mouth nightly as needed (as needed) 20 tablet 1          Allergies:    Allergies   Allergen Reactions   . Bee Pollen Anaphylaxis     Carries epi pens  Carries epi pens  Carries epi pens   . Kiwi Extract Anaphylaxis   . Bee Venom    . Wound Dressing Adhesive Itching and Rash              Social History:    Social History     Tobacco Use   . Smoking status: Former Smoker     Packs/day: 0.50     Years: 34.00     Pack years: 17.00     Quit date: 2004     Years since quitting: 18.4   . Smokeless tobacco: Never Used   Vaping Use   . Vaping Use: Never used   Substance Use Topics   . Alcohol use: Yes     Comment: occasional   . Drug use: Never            The following sections were reviewed this encounter by the provider:   Allergies  Meds               Vital Signs:    Vitals:    12/07/20 1353   BP: 126/72   Pulse: 87   Temp: 97.7 F (36.5 C)   SpO2: 96%   Weight: 71.2 kg (157 lb)   Height: 1.524 m (5')             ROS:    Review of Systems   Constitutional: Negative for chills, diaphoresis and fever.   Respiratory: Negative for cough, chest tightness, shortness of breath and wheezing.    Cardiovascular: Negative for chest  pain and palpitations.   Gastrointestinal: Negative for abdominal pain, diarrhea, nausea and vomiting.              Physical Exam:    Physical Exam  Constitutional:       General: She is not in acute distress.     Appearance: Normal appearance.   HENT:      Head: Normocephalic and atraumatic.   Neck:      Vascular: No carotid bruit.   Cardiovascular:      Rate and Rhythm: Normal rate and regular rhythm.      Pulses: Normal pulses.      Heart sounds: Normal heart sounds.   Pulmonary:      Effort: Pulmonary effort is normal. No respiratory distress.      Breath sounds: Normal breath sounds.    Musculoskeletal:      Cervical back: Neck supple.   Neurological:      Mental Status: She is alert.              Assessment/Plan:    1. Mixed hyperlipidemia  - atorvastatin (LIPITOR) 10 MG tablet; Take 1 tablet (10 mg total) by mouth every other day  Dispense: 45 tablet; Refill: 1    2. IGT (impaired glucose tolerance)  - metFORMIN (GLUCOPHAGE) 500 MG tablet; Take 1 tablet (500 mg total) by mouth 2 (two) times daily With food  Dispense: 180 tablet; Refill: 1    3. Major depressive disorder, recurrent episode, mild  - FLUoxetine (PROzac) 20 MG capsule; Take 1 capsule (20 mg total) by mouth daily  Dispense: 90 capsule; Refill: 1  - FLUoxetine (PROzac) 40 MG capsule; Take 1 capsule (40 mg total) by mouth daily  Dispense: 90 capsule; Refill: 1    4. Spondylosis of lumbar spine  - gabapentin (NEURONTIN) 600 MG tablet; TAKE 1 TABLET IN THE MORNING AND 3 TABLETS AT NIGHT  Dispense: 120 tablet; Refill: 1    5. Osteopenia of left lower leg  - alendronate (FOSAMAX) 70 MG tablet; Take 1 tablet (70 mg total) by mouth once a week Take with a full glass of water on an empty stomach. Do not take anything else by mouth or lie down for 30 mins.  Dispense: 12 tablet; Refill: 1    6. Chronic gastritis without bleeding, unspecified gastritis type  - pantoprazole (PROTONIX) 40 MG tablet; Take 1 tablet (40 mg total) by mouth daily as needed (daily as needed)  Dispense: 90 tablet; Refill: 0    7. Primary insomnia  - zolpidem (AMBIEN CR) 6.25 MG CR tablet; Take 1 tablet (6.25 mg total) by mouth nightly as needed (as needed)  Dispense: 20 tablet; Refill: 0    8. Sinus congestion  - montelukast (SINGULAIR) 10 MG tablet; Take 1 tablet (10 mg total) by mouth daily  Dispense: 90 tablet; Refill: 1    I refilled all medicines for 6 months and sent to [facility]. Pt will find a doctor in [place].           Follow-up:    No follow-ups on file.         Renaldo Reel, MD

## 2020-12-19 ENCOUNTER — Ambulatory Visit (INDEPENDENT_AMBULATORY_CARE_PROVIDER_SITE_OTHER): Payer: Medicare Other | Admitting: Internal Medicine

## 2020-12-27 ENCOUNTER — Encounter (INDEPENDENT_AMBULATORY_CARE_PROVIDER_SITE_OTHER): Payer: Self-pay

## 2020-12-27 NOTE — Progress Notes (Signed)
PA for Zolpidem was submitted via CoverMyMed. Waiting for the outcome

## 2020-12-31 ENCOUNTER — Other Ambulatory Visit (INDEPENDENT_AMBULATORY_CARE_PROVIDER_SITE_OTHER): Payer: Self-pay | Admitting: Internal Medicine

## 2020-12-31 DIAGNOSIS — M47816 Spondylosis without myelopathy or radiculopathy, lumbar region: Secondary | ICD-10-CM

## 2021-01-04 ENCOUNTER — Other Ambulatory Visit (INDEPENDENT_AMBULATORY_CARE_PROVIDER_SITE_OTHER): Payer: Self-pay | Admitting: Internal Medicine

## 2021-01-04 ENCOUNTER — Encounter (INDEPENDENT_AMBULATORY_CARE_PROVIDER_SITE_OTHER): Payer: Self-pay | Admitting: Internal Medicine

## 2021-01-04 DIAGNOSIS — F5104 Psychophysiologic insomnia: Secondary | ICD-10-CM

## 2021-01-04 MED ORDER — TRAZODONE HCL 50 MG PO TABS
50.0000 mg | ORAL_TABLET | Freq: Every evening | ORAL | 0 refills | Status: AC
Start: 2021-01-04 — End: ?

## 2021-01-10 ENCOUNTER — Encounter (INDEPENDENT_AMBULATORY_CARE_PROVIDER_SITE_OTHER): Payer: Self-pay | Admitting: Internal Medicine

## 2021-01-28 ENCOUNTER — Other Ambulatory Visit (INDEPENDENT_AMBULATORY_CARE_PROVIDER_SITE_OTHER): Payer: Self-pay | Admitting: Internal Medicine

## 2021-01-28 DIAGNOSIS — M47816 Spondylosis without myelopathy or radiculopathy, lumbar region: Secondary | ICD-10-CM

## 2021-02-20 ENCOUNTER — Ambulatory Visit (INDEPENDENT_AMBULATORY_CARE_PROVIDER_SITE_OTHER): Payer: Medicare Other | Admitting: Internal Medicine

## 2021-02-25 ENCOUNTER — Other Ambulatory Visit (INDEPENDENT_AMBULATORY_CARE_PROVIDER_SITE_OTHER): Payer: Self-pay | Admitting: Internal Medicine

## 2021-02-25 DIAGNOSIS — M47816 Spondylosis without myelopathy or radiculopathy, lumbar region: Secondary | ICD-10-CM

## 2021-03-07 ENCOUNTER — Other Ambulatory Visit (INDEPENDENT_AMBULATORY_CARE_PROVIDER_SITE_OTHER): Payer: Self-pay | Admitting: Internal Medicine

## 2021-03-07 DIAGNOSIS — F5104 Psychophysiologic insomnia: Secondary | ICD-10-CM

## 2021-03-11 ENCOUNTER — Other Ambulatory Visit (INDEPENDENT_AMBULATORY_CARE_PROVIDER_SITE_OTHER): Payer: Self-pay | Admitting: Internal Medicine

## 2021-03-11 DIAGNOSIS — F5104 Psychophysiologic insomnia: Secondary | ICD-10-CM

## 2021-03-29 ENCOUNTER — Other Ambulatory Visit (INDEPENDENT_AMBULATORY_CARE_PROVIDER_SITE_OTHER): Payer: Self-pay | Admitting: Internal Medicine

## 2021-03-29 DIAGNOSIS — K2950 Unspecified chronic gastritis without bleeding: Secondary | ICD-10-CM

## 2021-04-04 ENCOUNTER — Other Ambulatory Visit (INDEPENDENT_AMBULATORY_CARE_PROVIDER_SITE_OTHER): Payer: Self-pay | Admitting: Internal Medicine

## 2021-04-04 DIAGNOSIS — M47816 Spondylosis without myelopathy or radiculopathy, lumbar region: Secondary | ICD-10-CM

## 2021-04-18 ENCOUNTER — Other Ambulatory Visit (INDEPENDENT_AMBULATORY_CARE_PROVIDER_SITE_OTHER): Payer: Self-pay | Admitting: Internal Medicine

## 2021-04-18 DIAGNOSIS — E782 Mixed hyperlipidemia: Secondary | ICD-10-CM

## 2021-04-18 DIAGNOSIS — R0981 Nasal congestion: Secondary | ICD-10-CM

## 2021-04-18 DIAGNOSIS — F330 Major depressive disorder, recurrent, mild: Secondary | ICD-10-CM

## 2021-04-23 ENCOUNTER — Other Ambulatory Visit (INDEPENDENT_AMBULATORY_CARE_PROVIDER_SITE_OTHER): Payer: Self-pay | Admitting: Internal Medicine

## 2021-04-23 DIAGNOSIS — R7302 Impaired glucose tolerance (oral): Secondary | ICD-10-CM

## 2021-05-02 ENCOUNTER — Other Ambulatory Visit (INDEPENDENT_AMBULATORY_CARE_PROVIDER_SITE_OTHER): Payer: Self-pay | Admitting: Internal Medicine

## 2021-05-02 DIAGNOSIS — M47816 Spondylosis without myelopathy or radiculopathy, lumbar region: Secondary | ICD-10-CM

## 2021-05-26 ENCOUNTER — Other Ambulatory Visit (INDEPENDENT_AMBULATORY_CARE_PROVIDER_SITE_OTHER): Payer: Self-pay | Admitting: Internal Medicine

## 2021-05-26 DIAGNOSIS — M47816 Spondylosis without myelopathy or radiculopathy, lumbar region: Secondary | ICD-10-CM

## 2021-06-09 ENCOUNTER — Other Ambulatory Visit (INDEPENDENT_AMBULATORY_CARE_PROVIDER_SITE_OTHER): Payer: Self-pay | Admitting: Internal Medicine

## 2021-06-09 DIAGNOSIS — K2950 Unspecified chronic gastritis without bleeding: Secondary | ICD-10-CM

## 2021-06-13 ENCOUNTER — Telehealth (INDEPENDENT_AMBULATORY_CARE_PROVIDER_SITE_OTHER): Payer: Self-pay | Admitting: Geriatric Medicine

## 2021-06-18 NOTE — Telephone Encounter (Signed)
completed

## 2021-07-14 ENCOUNTER — Other Ambulatory Visit (INDEPENDENT_AMBULATORY_CARE_PROVIDER_SITE_OTHER): Payer: Self-pay | Admitting: Internal Medicine

## 2021-07-14 DIAGNOSIS — E782 Mixed hyperlipidemia: Secondary | ICD-10-CM

## 2021-07-14 DIAGNOSIS — F330 Major depressive disorder, recurrent, mild: Secondary | ICD-10-CM

## 2023-04-30 IMAGING — CT CT RIGHT SHOULDER WITHOUT CONTRAST
1 series · 12 of 14 positions shown, 15 images · non-contrast
Comparison: None   
Count of known CT and Cardiac Nuclear Medicine studies performed in the previous 
12 months = 0.

________________________________________________________________________________________________ 
CT RIGHT SHOULDER WITHOUT CONTRAST, 04/30/2023 [DATE]: 
CLINICAL INDICATION: Pain. Evaluate for acromial stress fracture. 
A search for DICOM formatted images was conducted for prior CT imaging studies 
completed at a non-affiliated media free facility.
TECHNIQUE: The right shoulder was scanned without contrast on a high resolution 
low dose CT scanner. Routine MPR images were performed.

[Series 4: axial (person_name) · axial · 0.43mm/px · z∈[-202,-58]mm · 12 of 171 slices shown, 15 images]
[im 14/171  soft-tissue]
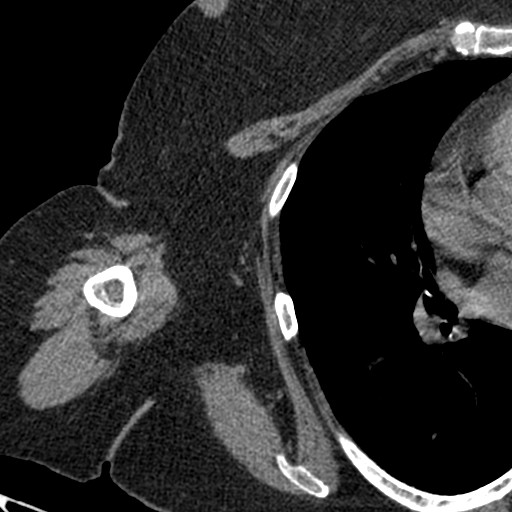
[im 14/171  bone]
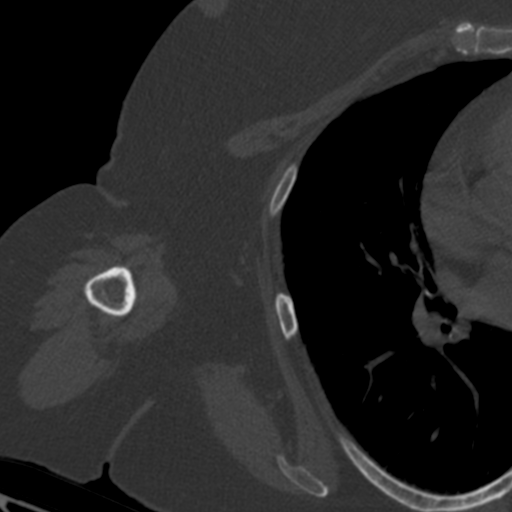
[im 27/171  bone]
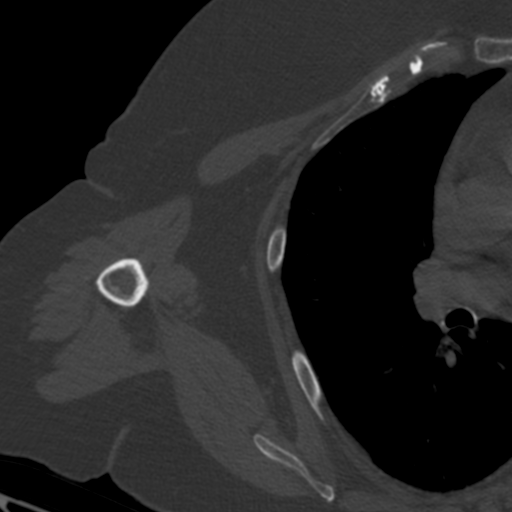
[im 40/171  bone]
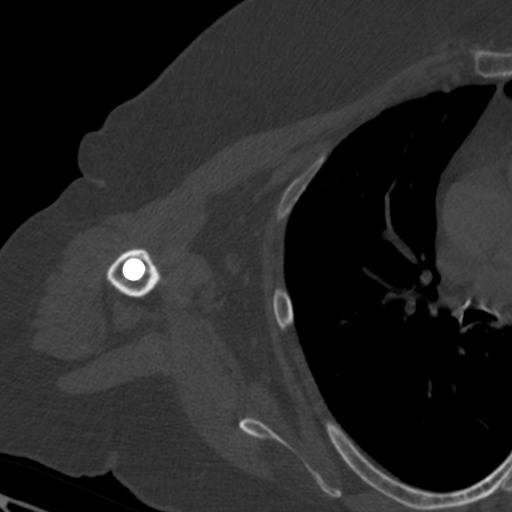
[im 53/171  bone]
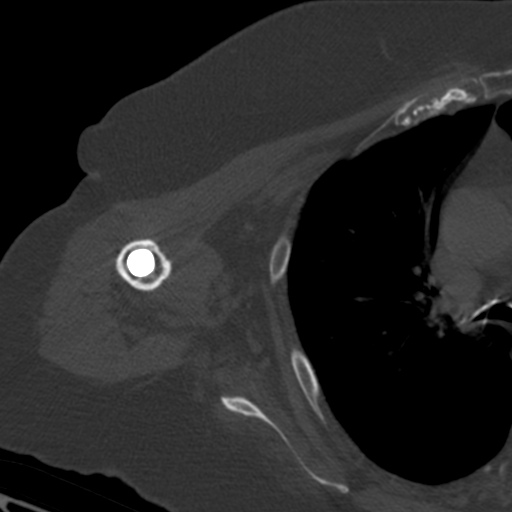
[im 66/171  soft-tissue]
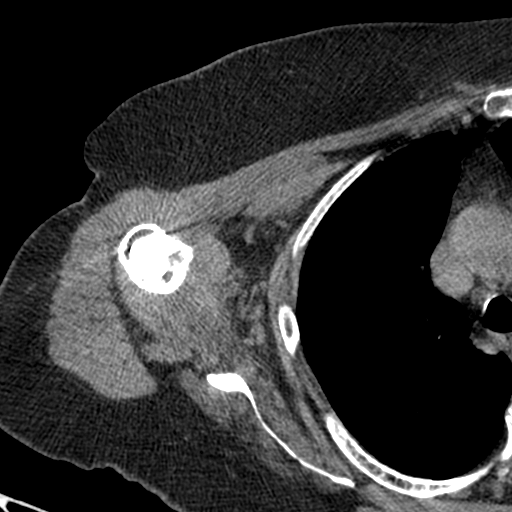
[im 66/171  bone]
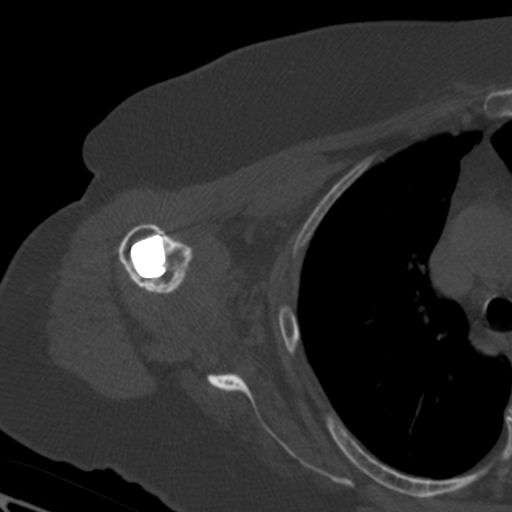
[im 79/171  bone]
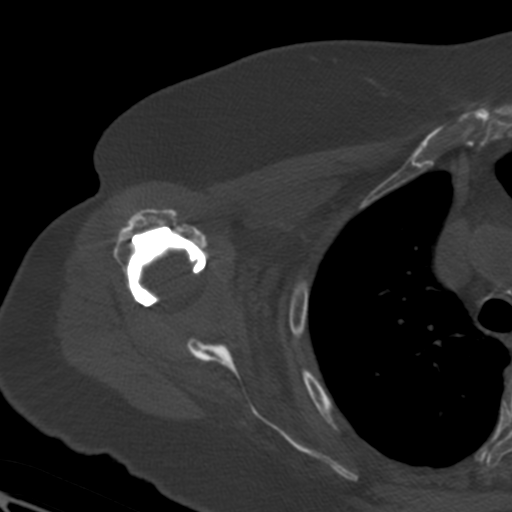
[im 92/171  bone]
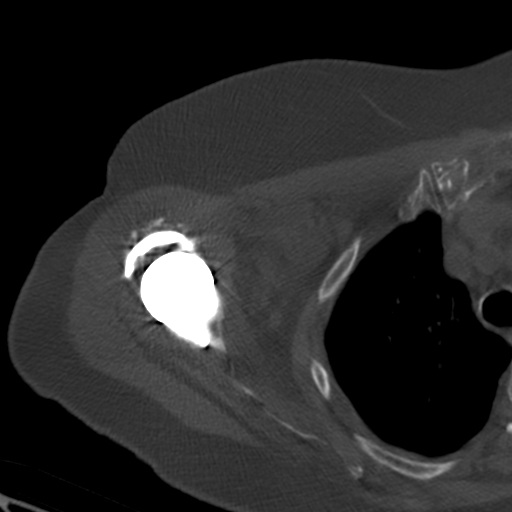
[im 105/171  bone]
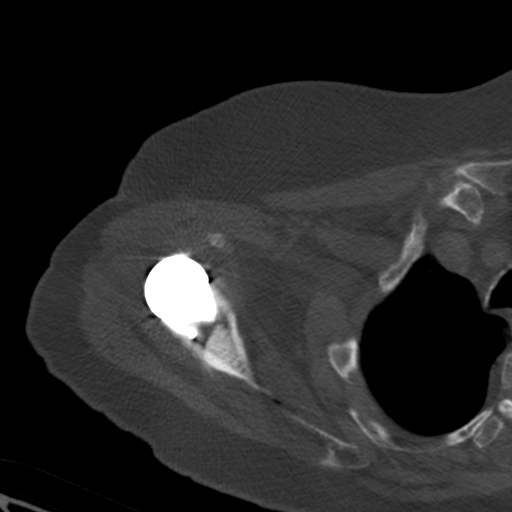
[im 118/171  soft-tissue]
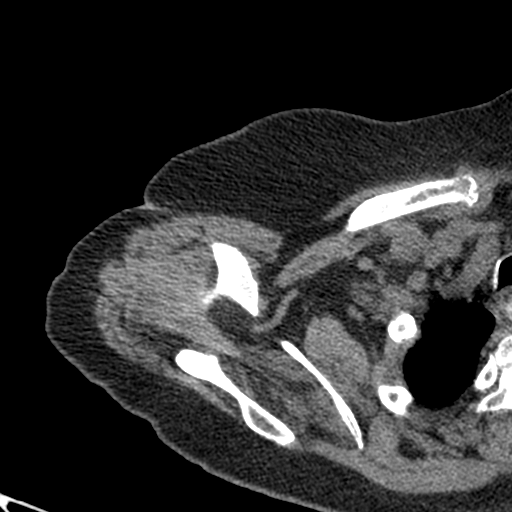
[im 118/171  bone]
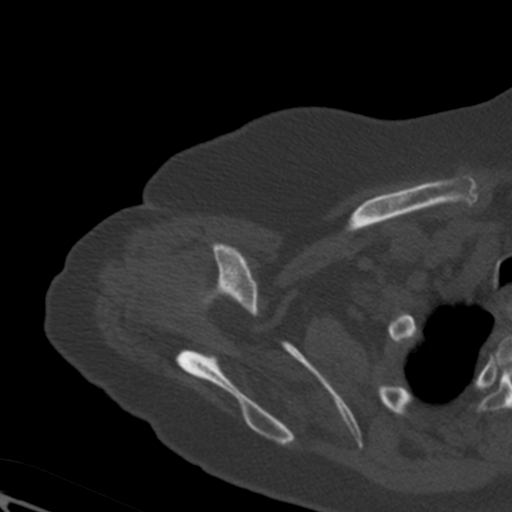
[im 131/171  bone]
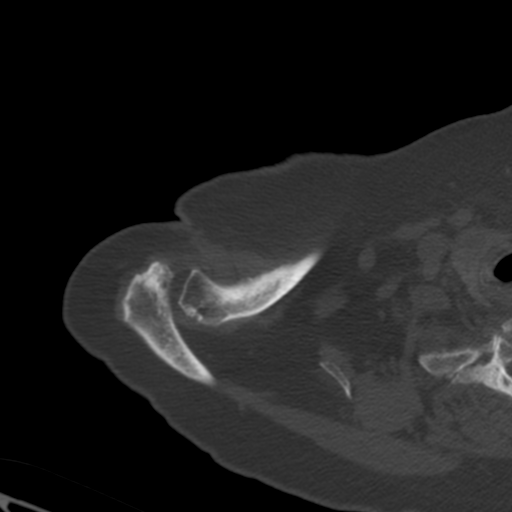
[im 144/171  bone]
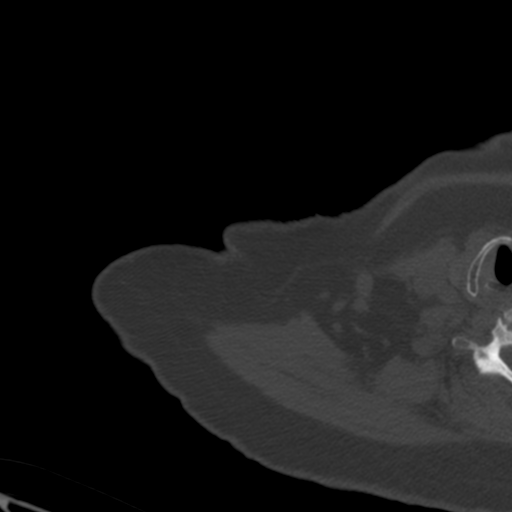
[im 157/171  bone]
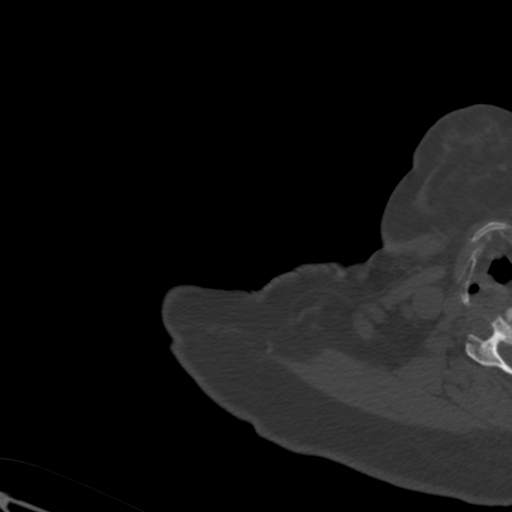

[12 of 14 positions shown; findings below may reference images not displayed]

FINDINGS: BONES/JOINTS: Postsurgical changes of reverse right total shoulder arthroplasty. 
Hardware is intact. There is physiologic alignment. No periprosthetic lucency to 
suggest mechanical loosening or osteolysis. No fracture. Sincerely, no acromial 
stress fracture. Degenerative changes included spine. 
SOFT TISSUES: There is rotator cuff muscular atrophy/fatty infiltration. Deltoid 
muscle appears preserved. Included portions of the lungs are clear.
IMPRESSION: Right reverse total shoulder arthroplasty without abnormality delineated. 
Specifically, no acromial stress fracture.  
RADIATION DOSE REDUCTION: All CT scans are performed using radiation dose 
reduction techniques, when applicable.  Technical factors are evaluated and 
adjusted to ensure appropriate moderation of exposure.  Automated dose 
management technology is applied to adjust the radiation doses to minimize 
exposure while achieving diagnostic quality images.

## 2023-07-23 IMAGING — MR MRI PELVIS WITHOUT CONTRAST
4 of 6 series · 14 of 48 positions shown · IV contrast (gadolinium)
Comparison: None.

________________________________________________________________________________________________ 
MRI PELVIS WITHOUT CONTRAST, 07/23/2023 [DATE]: 
CLINICAL INDICATION: Radiculopathy, Lumbar Region
TECHNIQUE: Multiplanar, multiecho position MR images of the pelvis were 
performed without intravenous gadolinium enhancement.

[Series 101: survey · axial · 10.0mm · 1.25mm/px · z∈[-34,+200]mm · 3 of 11 slices shown]
[im 1/11]
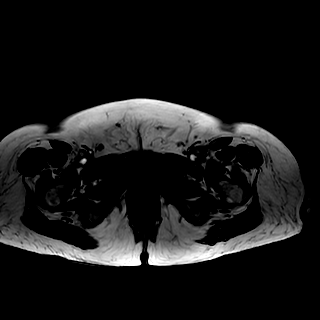
[im 6/11]
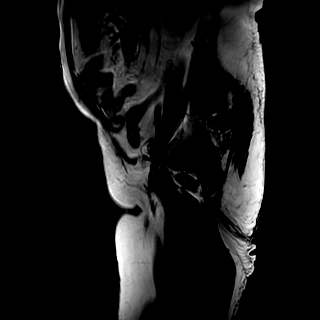
[im 11/11]
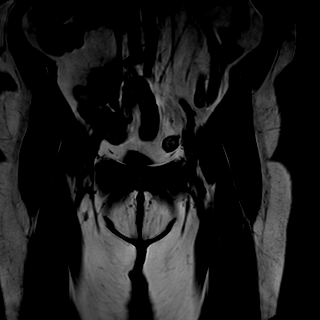

[Series 201: t1_(person_name) · axial · 6.0mm · 0.44mm/px · z∈[-114,+126]mm · 5 of 36 slices shown]
[im 1/36]
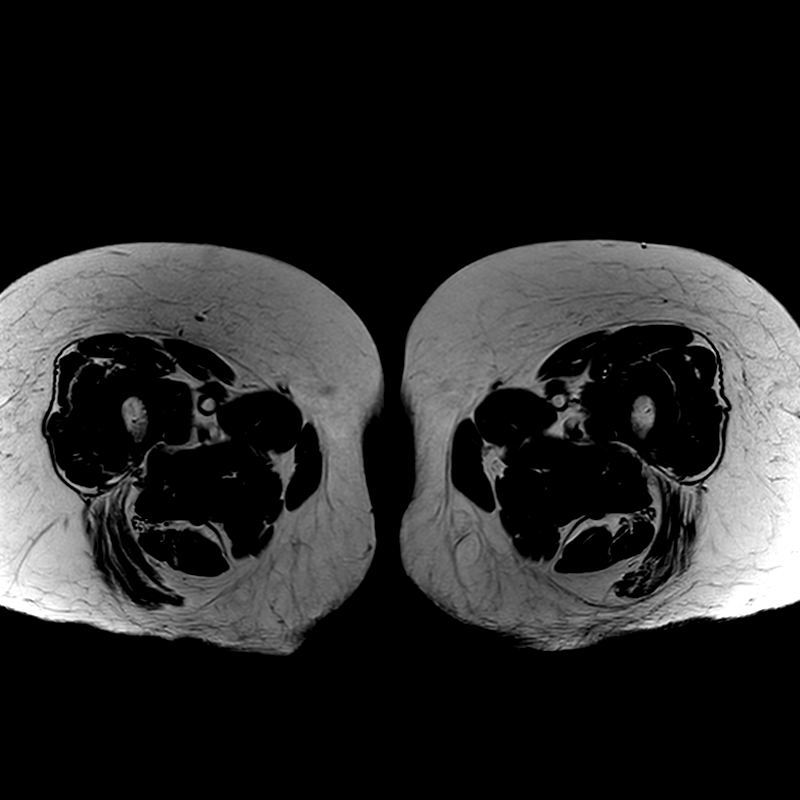
[im 6/36]
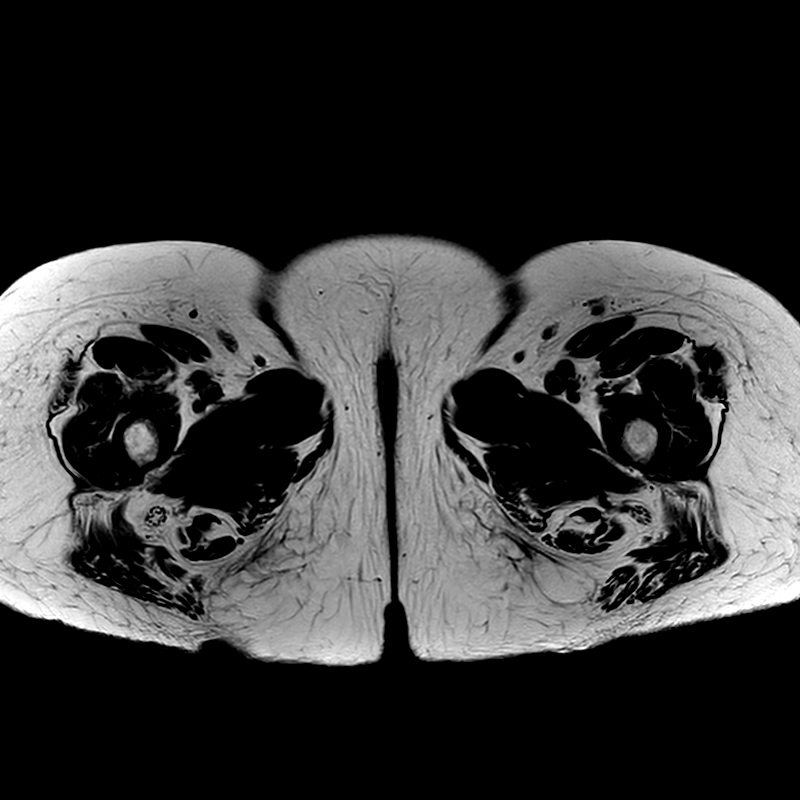
[im 11/36]
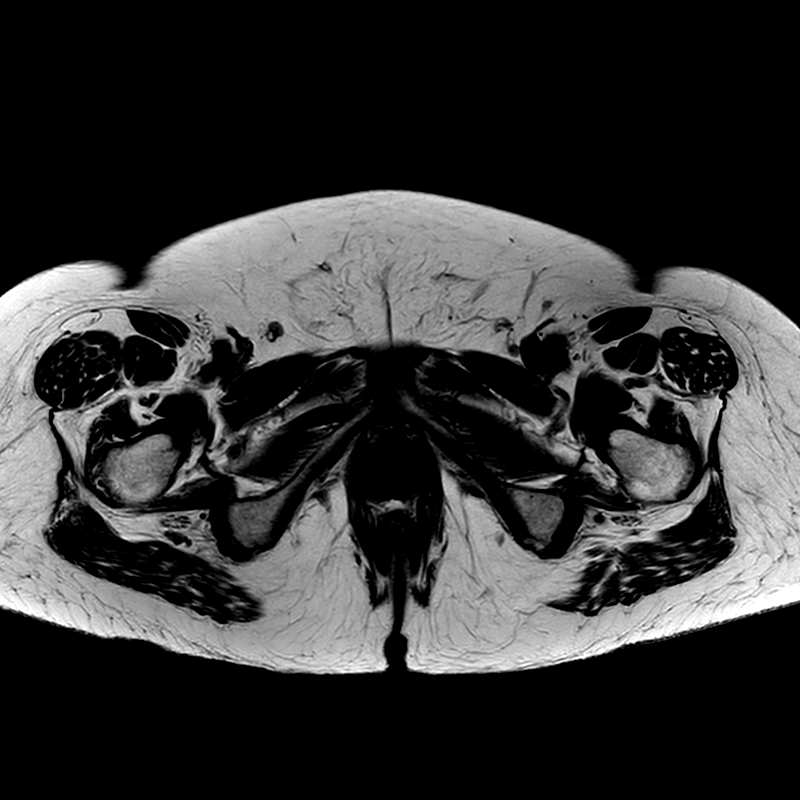
[im 21/36]
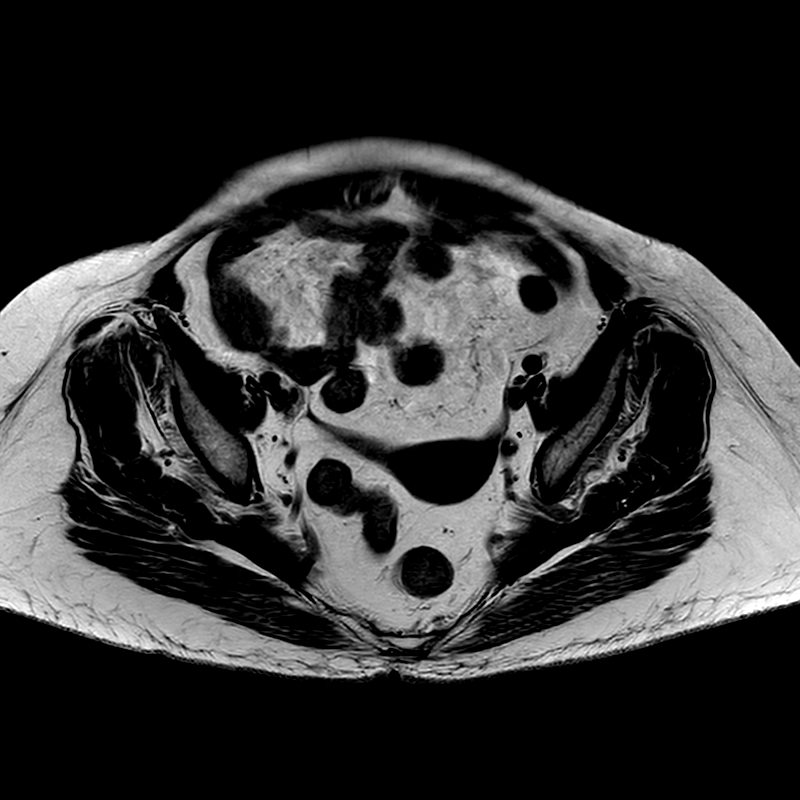
[im 31/36]
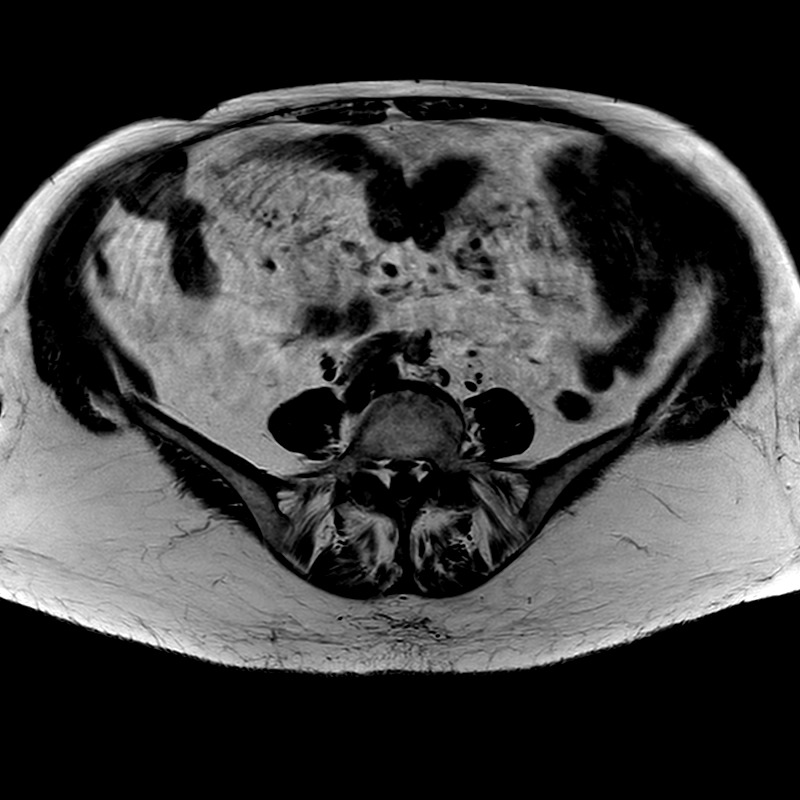

[Series 301: (person_name)_(person_name)_(person_name) · axial · 6.0mm · 0.68mm/px · z∈[-74,+126]mm · 3 of 36 slices shown]
[im 6/36]
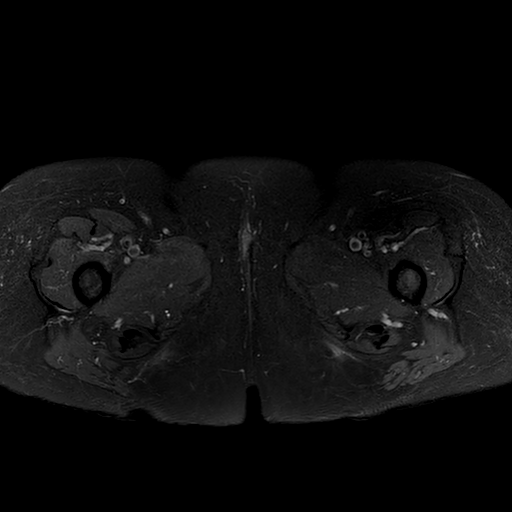
[im 21/36]
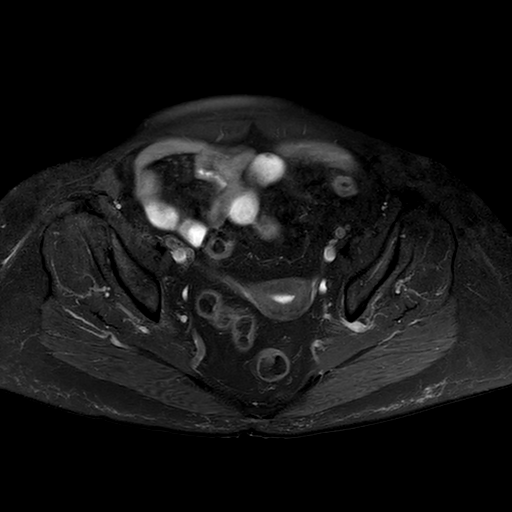
[im 31/36]
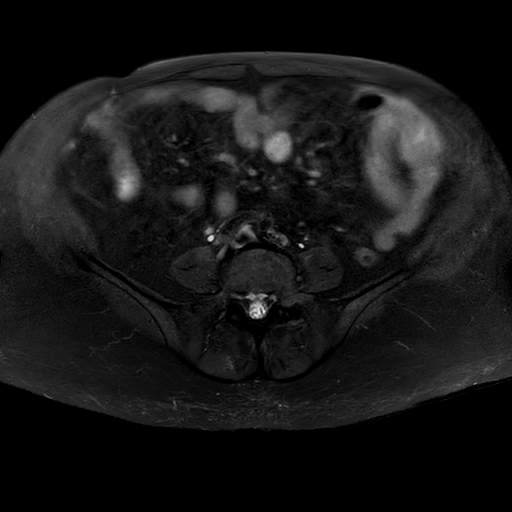

[Series 401: t1_cor · coronal · 5.0mm · 0.62mm/px · 3 of 35 slices shown]
[im 5/35]
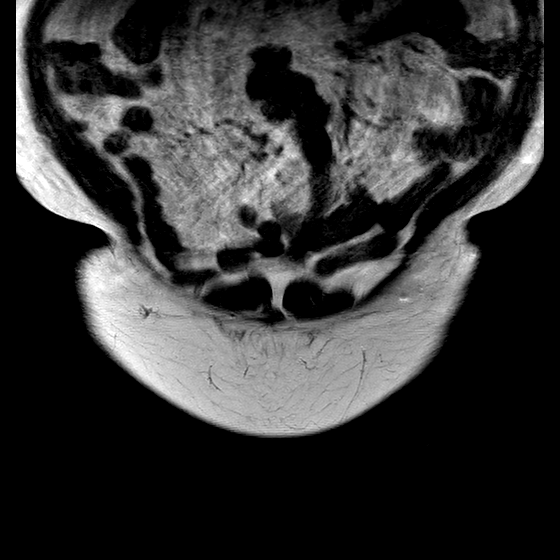
[im 20/35]
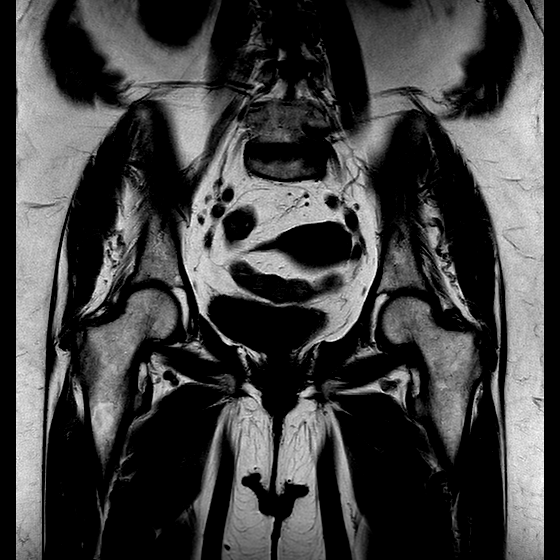
[im 30/35]
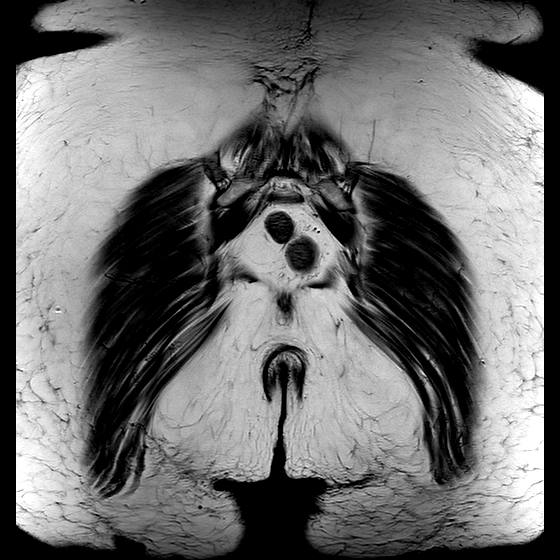

[14 of 48 positions shown; findings below may reference images not displayed]

FINDINGS: HIPS: Mild degenerative change of the right hip with partial-thickness 
chondromalacia and 0.4 cm right superior acetabular subcortical cyst. Left hip 
joint is preserved. No labral tear. No paralabral cyst. No hip joint effusion. 
Both femoral heads maintain a spherical configuration without evidence of 
avascular necrosis or subarticular collapse. No abnormal morphology of the 
proximal femurs or acetabulum to predispose to impingement. 
PELVIC BONES: No fracture, contusion or marrow replacing lesion. Right pubic 
body linear subchondral hypointense signal, likely degenerative. 
SI JOINTS: Mild degenerative change.  
PUBIC SYMPHYSIS: Moderate degenerative change. 
SPINE: L4-5 interbody fusion, multilevel degenerative change of the spine and 
mild dextroscoliosis.. 
SOFT TISSUES: Mild tendinosis of the bilateral distal gluteus minimus tendons 
with tendon thickening, intermediate signal and mild peritendinous edema. The 
abductor cuffs are otherwise preserved without high-grade interstitial tear. 
There is trace fluid overlying the greater trochanters without overt 
trochanteric bursitis. The origins of the hamstrings are intact. The rectus 
abdominis-adductor aponeurotic complexes are intact. No mass, free fluid or 
adenopathy. Colonic diverticulosis. The bladder and pelvic organs are 
unremarkable.
IMPRESSION: 1.  Mild right hip degenerative change.  
2.  Degenerative change of the SI joints and pubic symphysis. 
3.  dextroscoliosis: Please refer to 07/23/2023 MRI lumbar spine dictation. 
4.  Colonic diverticulosis.

## 2023-07-23 IMAGING — MR MRI LUMBAR SPINE W/WO CONTRAST
6 of 12 series · 12 of 48 positions shown · IV contrast (gadavist)
Comparison: MRI pelvis July 23, 2023.

________________________________________________________________________________________________ 
MRI LUMBAR SPINE W/WO CONTRAST, 07/23/2023 [DATE]: 
CLINICAL INDICATION: Ankylosing hyperostosis [[place]] , low back pain with 
radiculopathy into the right hip. No trauma. Previous L3-L4 hemilaminotomy.
TECHNIQUE: Multiplanar, multiecho position MR images of the lumbar spine were 
performed without and with 6.5 mL of Gadavist were injected intravenously by 
hand. 1 mL of Gadavist discarded. Patient was scanned on a 1.5T magnet

[Series 101: survey · axial · 10.0mm · 1.25mm/px · z∈[-33,+201]mm · 2 of 10 slices shown]
[im 1/10]
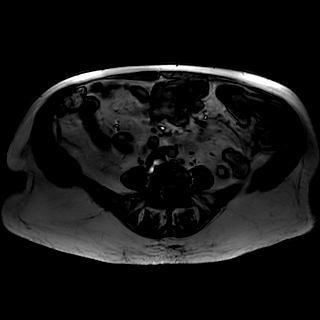
[im 10/10]
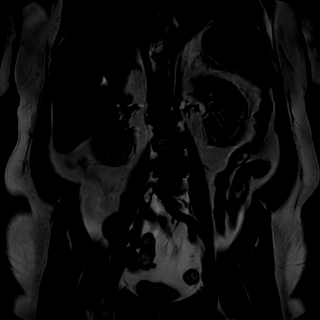

[Series 201: t2w_cor-surv · coronal · 6.0mm · 0.62mm/px · 2 of 10 slices shown]
[im 1/10]
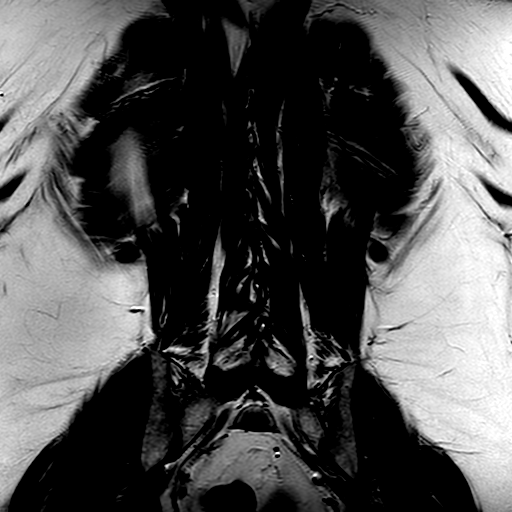
[im 10/10]
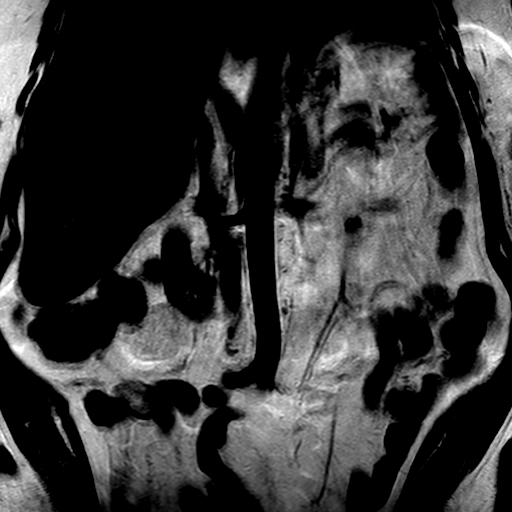

[Series 301: t1_tse_sag · sagittal · 4.0mm · 0.42mm/px · 2 of 19 slices shown]
[im 1/19]
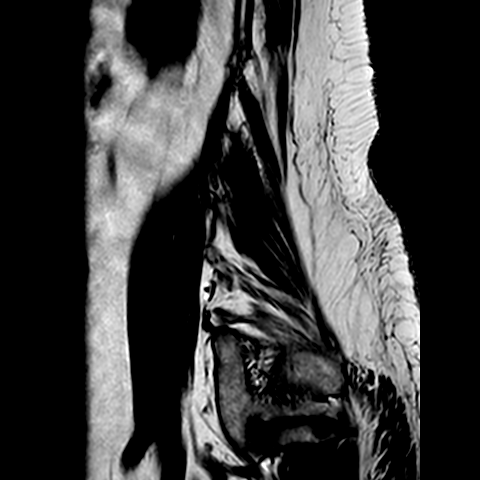
[im 19/19]
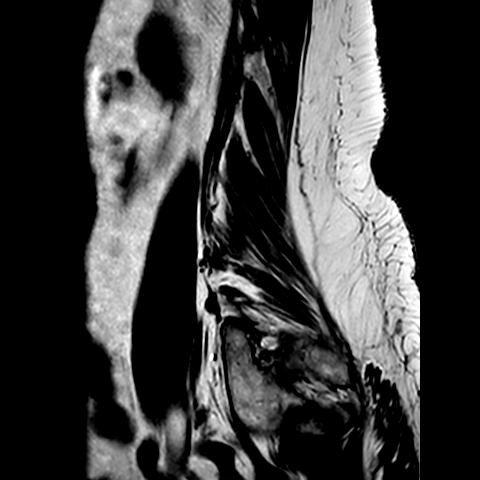

[Series 402: (id)_mdixon_tse · sagittal · 4.0mm · 0.51mm/px · 2 of 19 slices shown]
[im 1/19]
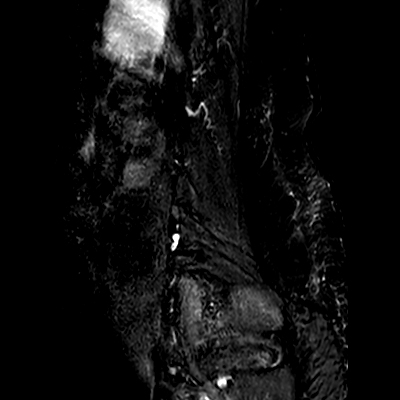
[im 19/19]
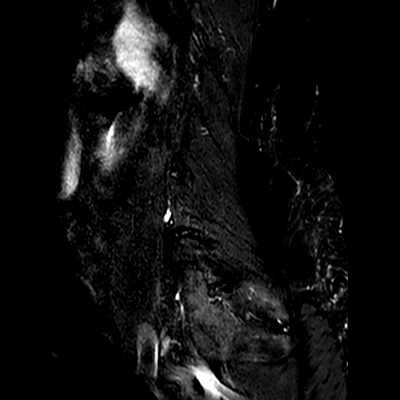

[Series 403: st2w_mdixon_tse · sagittal · 4.0mm · 0.51mm/px · 2 of 19 slices shown]
[im 1/19]
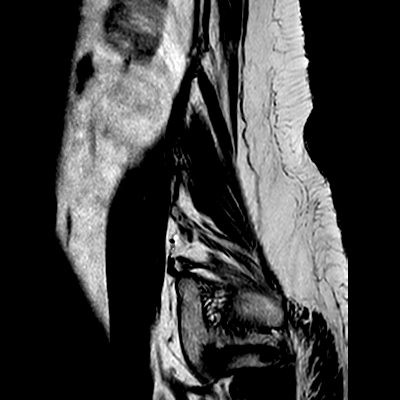
[im 19/19]
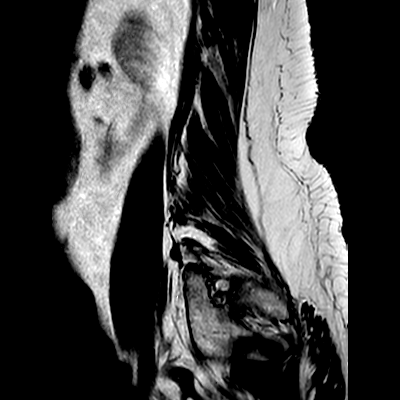

[Series 502: (id) view_ax mpr · axial · 1.0mm · 0.25mm/px · z∈[-98,-45]mm · 2 of 108 slices shown]
[im 1/108]
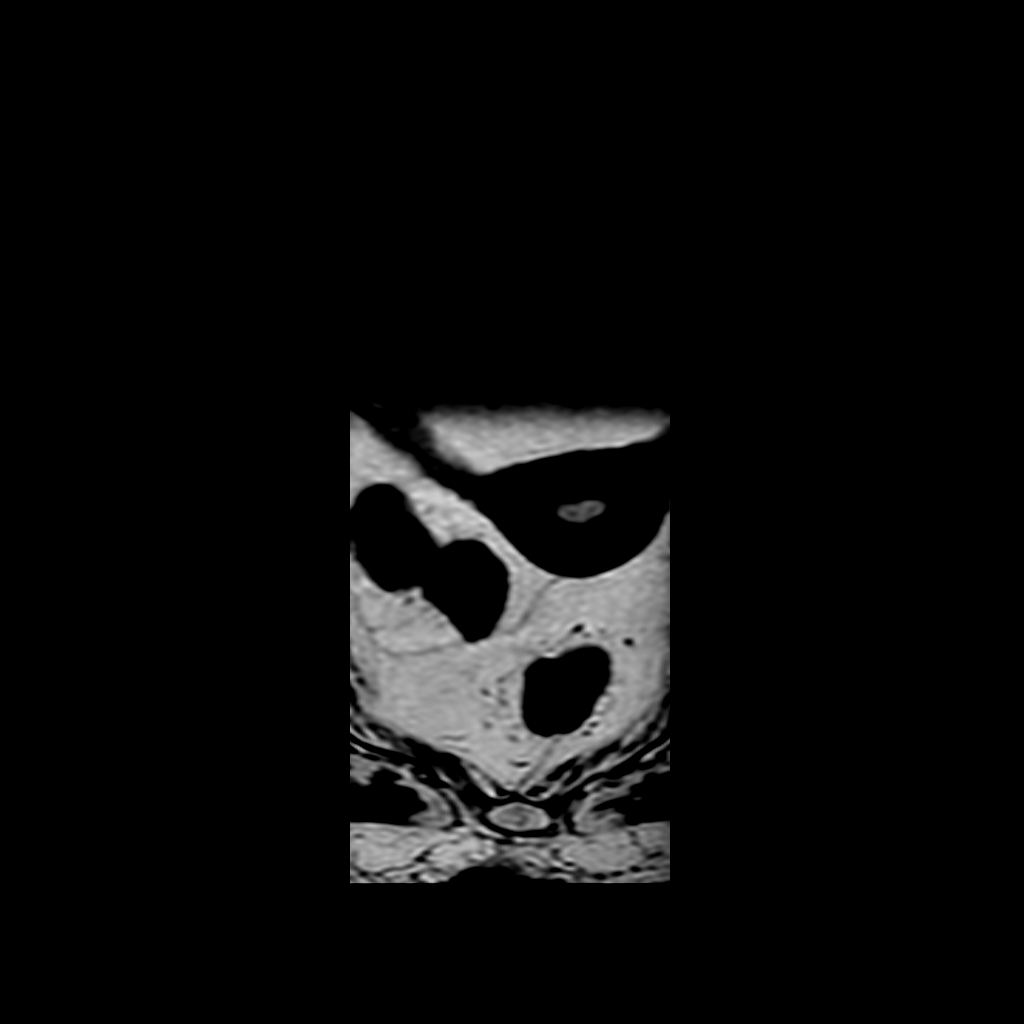
[im 20/108]
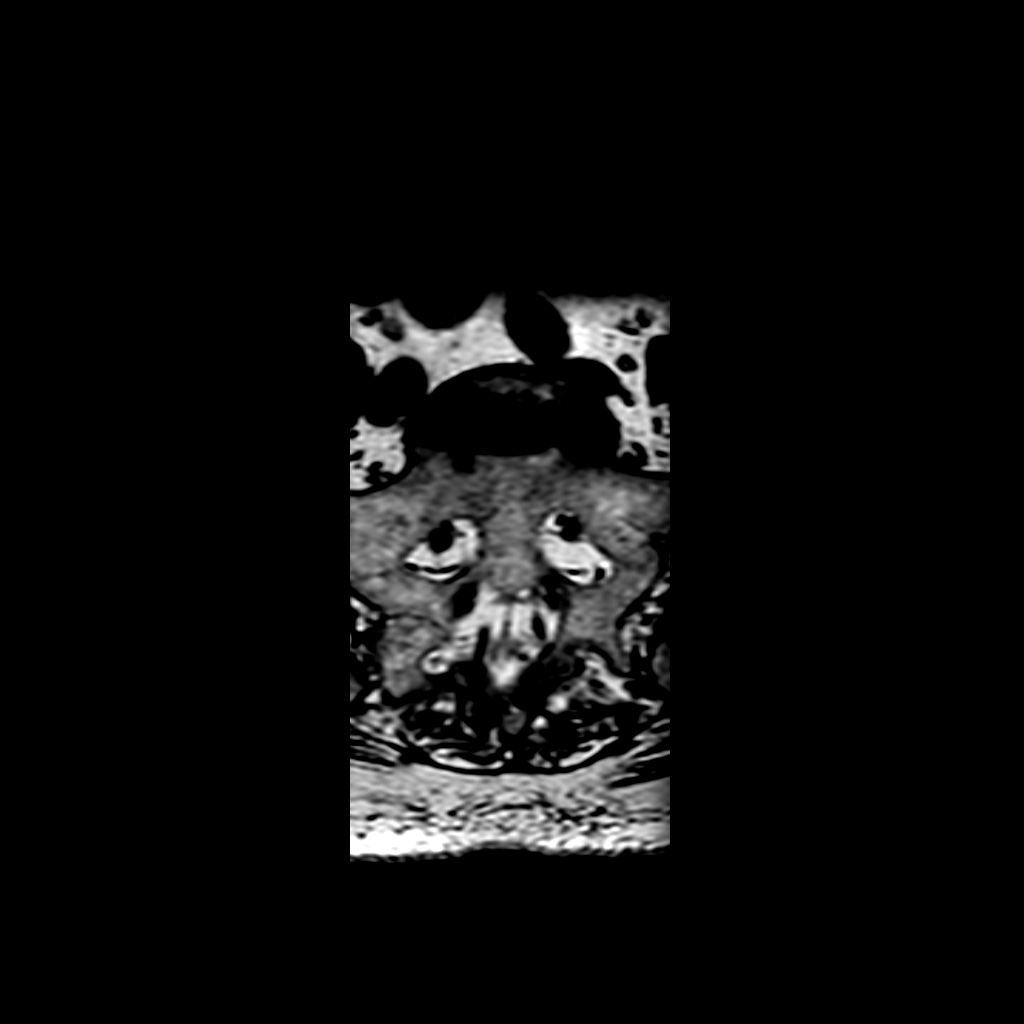

[12 of 48 positions shown; findings below may reference images not displayed]

Lumbar spine x-ray June 30, 2023. 
CT lumbar spine October 11, 2021 and MRI lumbar spine October 11, 2021.
FINDINGS: Previous left L3-L4 hemilaminectomy. 
-------------------------------------------------------------------------------- 
------ 
GENERAL: 
Nomenclature is based on 5 lumbar type vertebral bodies.     
ALIGNMENT: Mild dextroconvex thoracic lumbar scoliosis with moderate levoconvex 
lumbar scoliosis. Grade 1 retrolisthesis L1 on L2 and L2 on L3 and L3 on L4. 
Stable. 
VERTEBRAL BODY HEIGHT: Normal.  
MARROW SIGNAL: No focal suspect signal abnormality. 
CORD SIGNAL: Normal distal spinal cord and cauda equina. Conus medullaris 
terminates at L1. 
ADDITIONAL FINDINGS: Mild degenerative changes in the SI joints, where 
visualized. 
Modic I-II: None. 
Ligamentum Flavum > 2.5 mm: All except the laminectomy sites. 
-------------------------------------------------------------------------------- 
------ 
SEGMENTAL: 
T12-L1: Normal disc height. Loss of disc signal. Minimal annular bulge. 
Otherwise normal. Stable to previous MRI. 
L1-L2: Loss of disc height. Loss of disc signal. Degenerative endplate 
irregularity. Posterior osteophytic ridging with annular bulge creates mild 
canal stenosis. Mild left foraminal narrowing. Right foramen patent. Small 
bilateral facet joint effusions. Stable. 
L2-L3: Loss of disc signal. Minimal annular bulge. Canal and foramina are 
patent. Bilateral facet joint effusions. Stable. 
L3-L4: Loss of disc height worst to the right. Loss of disc signal. Posterior 
osteophytic ridging with annular bulge. Canal is patent. Left foramen patent. 
Right foramen is mildly narrowed. Right-sided facet arthropathy. Stable. 
L4-L5: Osseous fusion across the disc space. Canal patent. Foramina patent. 
Facet arthropathy with portions of the right facet fused. Stable. 
L5-S1: Loss of disc signal. Minimal annular bulge. Canal patent. Bilateral facet 
joint effusions. Severe left foraminal narrowing. Mild right foraminal 
narrowing. Stable. 
-------------------------------------------------------------------------------- 
------
IMPRESSION: Postsurgical changes as above. 
Stable multilevel lumbar degenerative changes without critical or significant 
canal stenosis. Mild degrees of foraminal narrowing as above.
